# Patient Record
Sex: Female | Born: 1950 | Race: Black or African American | Hispanic: No | Marital: Married | Smoking: Former smoker
Health system: Southern US, Community
[De-identification: ages and names within clinical notes are randomized; demographics above are authoritative.]

## PROBLEM LIST (undated history)

## (undated) DIAGNOSIS — M329 Systemic lupus erythematosus, unspecified: Secondary | ICD-10-CM

## (undated) DIAGNOSIS — M797 Fibromyalgia: Secondary | ICD-10-CM

## (undated) DIAGNOSIS — M810 Age-related osteoporosis without current pathological fracture: Secondary | ICD-10-CM

## (undated) DIAGNOSIS — R0789 Other chest pain: Secondary | ICD-10-CM

## (undated) DIAGNOSIS — R0602 Shortness of breath: Secondary | ICD-10-CM

## (undated) DIAGNOSIS — M069 Rheumatoid arthritis, unspecified: Secondary | ICD-10-CM

## (undated) HISTORY — DX: Other chest pain: R07.89

## (undated) HISTORY — DX: Rheumatoid arthritis, unspecified: M06.9

## (undated) HISTORY — DX: Shortness of breath: R06.02

## (undated) HISTORY — DX: Systemic lupus erythematosus, unspecified: M32.9

## (undated) HISTORY — DX: Fibromyalgia: M79.7

## (undated) HISTORY — DX: Age-related osteoporosis without current pathological fracture: M81.0

## (undated) HISTORY — PX: BILATERAL CARPAL TUNNEL RELEASE: SHX6508

## (undated) HISTORY — PX: TUBAL LIGATION: SHX77

## (undated) HISTORY — PX: EYE SURGERY: SHX253

## (undated) HISTORY — PX: ABDOMINAL HYSTERECTOMY: SHX81

---

## 2015-05-26 DIAGNOSIS — M797 Fibromyalgia: Secondary | ICD-10-CM | POA: Diagnosis not present

## 2015-05-26 DIAGNOSIS — M810 Age-related osteoporosis without current pathological fracture: Secondary | ICD-10-CM | POA: Diagnosis not present

## 2015-05-26 DIAGNOSIS — M329 Systemic lupus erythematosus, unspecified: Secondary | ICD-10-CM | POA: Diagnosis not present

## 2015-05-26 DIAGNOSIS — M0579 Rheumatoid arthritis with rheumatoid factor of multiple sites without organ or systems involvement: Secondary | ICD-10-CM | POA: Diagnosis not present

## 2015-06-09 DIAGNOSIS — M810 Age-related osteoporosis without current pathological fracture: Secondary | ICD-10-CM | POA: Diagnosis not present

## 2015-06-09 DIAGNOSIS — M85841 Other specified disorders of bone density and structure, right hand: Secondary | ICD-10-CM | POA: Diagnosis not present

## 2015-06-09 DIAGNOSIS — M85842 Other specified disorders of bone density and structure, left hand: Secondary | ICD-10-CM | POA: Diagnosis not present

## 2015-06-09 DIAGNOSIS — M329 Systemic lupus erythematosus, unspecified: Secondary | ICD-10-CM | POA: Diagnosis not present

## 2015-06-09 DIAGNOSIS — M180 Bilateral primary osteoarthritis of first carpometacarpal joints: Secondary | ICD-10-CM | POA: Diagnosis not present

## 2015-06-09 DIAGNOSIS — M797 Fibromyalgia: Secondary | ICD-10-CM | POA: Diagnosis not present

## 2015-06-09 DIAGNOSIS — M0579 Rheumatoid arthritis with rheumatoid factor of multiple sites without organ or systems involvement: Secondary | ICD-10-CM | POA: Diagnosis not present

## 2015-06-09 DIAGNOSIS — Z1231 Encounter for screening mammogram for malignant neoplasm of breast: Secondary | ICD-10-CM | POA: Diagnosis not present

## 2016-03-31 ENCOUNTER — Encounter: Payer: Self-pay | Admitting: Endocrinology

## 2016-03-31 ENCOUNTER — Ambulatory Visit (INDEPENDENT_AMBULATORY_CARE_PROVIDER_SITE_OTHER): Payer: Medicare Other | Admitting: Endocrinology

## 2016-03-31 VITALS — BP 126/88 | HR 80 | Wt 206.0 lb

## 2016-03-31 DIAGNOSIS — N951 Menopausal and female climacteric states: Secondary | ICD-10-CM

## 2016-03-31 DIAGNOSIS — M0579 Rheumatoid arthritis with rheumatoid factor of multiple sites without organ or systems involvement: Secondary | ICD-10-CM

## 2016-03-31 DIAGNOSIS — R946 Abnormal results of thyroid function studies: Secondary | ICD-10-CM | POA: Diagnosis not present

## 2016-03-31 DIAGNOSIS — R635 Abnormal weight gain: Secondary | ICD-10-CM

## 2016-03-31 DIAGNOSIS — R5383 Other fatigue: Secondary | ICD-10-CM | POA: Diagnosis not present

## 2016-03-31 DIAGNOSIS — R7989 Other specified abnormal findings of blood chemistry: Secondary | ICD-10-CM

## 2016-03-31 DIAGNOSIS — M069 Rheumatoid arthritis, unspecified: Secondary | ICD-10-CM | POA: Insufficient documentation

## 2016-03-31 LAB — T3, FREE: T3 FREE: 2.9 pg/mL (ref 2.3–4.2)

## 2016-03-31 LAB — T4, FREE: FREE T4: 0.66 ng/dL (ref 0.60–1.60)

## 2016-03-31 LAB — TSH: TSH: 0.4 u[IU]/mL (ref 0.35–4.50)

## 2016-03-31 LAB — FOLLICLE STIMULATING HORMONE: FSH: 99.6 m[IU]/mL

## 2016-03-31 NOTE — Progress Notes (Signed)
Patient ID: Kristina Shannon, female   DOB: 1951/04/09, 65 y.o.   MRN: [id number]            Referring physician: [place]  Chief complaint: Weight gain.  History of Present Illness:  She had called to her physician for evaluation of various symptoms including progressive weight gain She thinks she has gained about 20 pounds over the last year. She also has been having more fatigue. For this reason her thyroid levels were checked and she was seen to have a TSH level of 0.39 but normal free thyroxine index She is referred for further evaluation Has not had a history of goiter in the past   Past Medical History:  Diagnosis Date  . Fibromyalgia   . Lupus   . Osteoporosis   . RA (rheumatoid arthritis) (HCC)     Past Surgical History:  Procedure Laterality Date  . ABDOMINAL HYSTERECTOMY    . EYE SURGERY Bilateral     Family History  Problem Relation Age of Onset  . Congenital heart disease Mother   . Stroke Mother   . Blindness Mother   . Leukemia Father   . Acute lymphoblastic leukemia Father   . Heart disease Father   . Lung cancer Brother   . Throat cancer Brother     Social History:  reports that she quit smoking about a year ago. She has never used smokeless tobacco. Her alcohol and drug histories are not on file.  Allergies: No Known Allergies    Medication List       Accurate as of 03/31/16 11:39 AM. Always use your most recent med list.          acetaminophen 325 MG tablet Commonly known as:  TYLENOL Take 650 mg by mouth every 6 (six) hours as needed.   folic acid 1 MG tablet Commonly known as:  FOLVITE Take 1 mg by mouth daily.   Methotrexate (PF) 25 MG/0.4ML Soaj Inject 25 mg into the skin once a week.   ORENCIA CLICKJECT 125 MG/ML Soaj Generic drug:  Abatacept Inject 125 mg into the skin once a week.   predniSONE 5 MG tablet Commonly known as:  DELTASONE Take 5 mg by mouth daily with breakfast.       LABS:  No visits with results within  1 Week(s) from this visit.  Latest known visit with results is:  No results found for any previous visit.       Review of Systems  Constitutional: Positive for weight gain.  Respiratory: Positive for shortness of breath.   Cardiovascular: Positive for chest pain.  Gastrointestinal: Negative for constipation and diarrhea.  Endocrine: Negative for cold intolerance and decreased concentration.  Musculoskeletal: Positive for muscle aches and muscle cramps.  Skin: Positive for rash.       Recently No rash, has had lupus before  Psychiatric/Behavioral: Positive for insomnia.       > 1 year     PHYSICAL EXAM:  BP 126/88   Pulse 80   Wt 206 lb (93.4 kg)   GENERAL: She has mild generalized obesity There is no supraclavicular fat pads or buffalo hump  No pallor, clubbing, lymphadenopathy  She has significant lower leg edema which is more on the left.  There edema spitting on the shin areas but not laterally and appears to be more for spongy feel.  Has some swelling of the left foot  Skin:  no rash or pigmentation.  Mild thinning of the skin on forearms present  EYES:  Externally normal.  Fundii:  normal discs and vessels.  ENT: Oral mucosa and tongue normal.  THYROID:  Not palpable.  HEART:  Normal  S1 and S2; no murmur or click.  CHEST:  Normal shape.  Lungs: Vescicular breath sounds heard equally.  No crepitations/ wheeze.  ABDOMEN:  No distention.  Liver and spleen not palpable.  No other mass or tenderness.  NEUROLOGICAL: .Reflexes are bilaterally difficult to elicit at ankles, normal at biceps with some slowing of the relaxation.  JOINTS:  She has swelling and some deformity of the left finger joints.   ASSESSMENT:    WEIGHT gain: This is likely to be a combination of stopping smoking a year ago, use of prednisone especially early part of this year, limited physical activity and possible depression  Low normal TSH level: This is not in the hyperthyroid range and not  associated with any symptoms or goiter.  Free thyroxine index was normal and most likely this is of no clinical significance  Leg edema: May be related to venous insufficiency or possibly lymphedema, to discuss with PCP   PLAN:    Recheck thyroid panel including free T3 today.  Discussed that if her thyroid levels including free T4 and free T3 are normal low further action needed.  If free T4 is low may indicate secondary hypothyroidism  Check if assessed to confirm that her pituitary function is normal  Further management to be done if labs are normal otherwise she can follow-up with PCP  She may benefit from nutrition consultation and water exercises   Consultation note sent to the referring physician  [facility] 03/31/2016, 11:39 AM

## 2016-03-31 NOTE — Progress Notes (Signed)
Please let patient know that the thyroid level is low normal secondary to borderline pituitary function but her hormone levels indicated normal pituitary gland health. Would like to recheck her thyroid levels in 3 months to see if this is changed, no need for medication at this time

## 2016-07-07 ENCOUNTER — Other Ambulatory Visit: Payer: Self-pay | Admitting: Family Medicine

## 2016-07-07 ENCOUNTER — Encounter: Payer: Self-pay | Admitting: Family Medicine

## 2016-07-07 ENCOUNTER — Ambulatory Visit (INDEPENDENT_AMBULATORY_CARE_PROVIDER_SITE_OTHER): Payer: Medicare Other | Admitting: Family Medicine

## 2016-07-07 VITALS — BP 99/72 | HR 85 | Ht 63.0 in | Wt 197.6 lb

## 2016-07-07 DIAGNOSIS — R350 Frequency of micturition: Secondary | ICD-10-CM

## 2016-07-07 DIAGNOSIS — Z01419 Encounter for gynecological examination (general) (routine) without abnormal findings: Secondary | ICD-10-CM

## 2016-07-07 DIAGNOSIS — K5901 Slow transit constipation: Secondary | ICD-10-CM

## 2016-07-07 DIAGNOSIS — Z1231 Encounter for screening mammogram for malignant neoplasm of breast: Secondary | ICD-10-CM

## 2016-07-07 DIAGNOSIS — M329 Systemic lupus erythematosus, unspecified: Secondary | ICD-10-CM | POA: Insufficient documentation

## 2016-07-07 DIAGNOSIS — IMO0002 Reserved for concepts with insufficient information to code with codable children: Secondary | ICD-10-CM | POA: Insufficient documentation

## 2016-07-07 DIAGNOSIS — M3219 Other organ or system involvement in systemic lupus erythematosus: Secondary | ICD-10-CM

## 2016-07-07 DIAGNOSIS — Z0000 Encounter for general adult medical examination without abnormal findings: Secondary | ICD-10-CM

## 2016-07-07 DIAGNOSIS — M810 Age-related osteoporosis without current pathological fracture: Secondary | ICD-10-CM

## 2016-07-07 DIAGNOSIS — M797 Fibromyalgia: Secondary | ICD-10-CM

## 2016-07-07 DIAGNOSIS — Z01411 Encounter for gynecological examination (general) (routine) with abnormal findings: Secondary | ICD-10-CM

## 2016-07-07 LAB — POCT URINALYSIS DIP (DEVICE)
BILIRUBIN URINE: NEGATIVE
Glucose, UA: NEGATIVE mg/dL
Hgb urine dipstick: NEGATIVE
KETONES UR: NEGATIVE mg/dL
Leukocytes, UA: NEGATIVE
NITRITE: NEGATIVE
PH: 5.5 (ref 5.0–8.0)
PROTEIN: NEGATIVE mg/dL
Specific Gravity, Urine: 1.01 (ref 1.005–1.030)
Urobilinogen, UA: 0.2 mg/dL (ref 0.0–1.0)

## 2016-07-07 MED ORDER — DOCUSATE SODIUM 100 MG PO CAPS: 100.0000 mg | ORAL_CAPSULE | Freq: Two times a day (BID) | ORAL | 2 refills | Status: DC | PRN

## 2016-07-07 NOTE — Progress Notes (Signed)
Subjective:     Patient ID: Kristina Shannon, female   DOB: 01-26-1951, 66 y.o.   MRN: [id number]  HPI Kristina Shannon is a 66 yo woman presenting to establish care and for screening mammogram referral (last mammogram normal 2 years ago).  She notes she has a family history of breast cancer in her daughter at age 81.  She denies breast pain or masses, but states that she does not perform self-breast exams.  She also complains of frequent urination for the past year.  She urinates once an hour and reports damp underwear throughout the day.  She endorses polydipsia and tingling in her extremities, but she associates the latter with her fibromyalgia.  She notes she has a history of frequent UTI.  She denies fever, dysuria, hematuria, vaginal bleeding, vaginal discharge, or vaginal irritation.  She also requests a medication to help with her chronic constipation.  She is not having difficulty with BM at this time, but has been hospitalized due to severe constipation in the past and would like a medication to use as needed for this issue.  Review of Systems as above     Objective:   Physical Exam Gen: comfortable, in NAD Neck: non-tender with no masses Cv: RRR, no m/r/g Pulm: clear to auscultation bilaterally, no increased work of breathing, no crackles of wheezes Abd: normoactive bowel sounds, non-tender, no masses or hernias, visible lower vertical scar from previous total hysterectomy. Breast: normal appearing, no masses or lesions, no LAD    Assessment:     Kristina Shannon is a 66 yo woman G6 para 3 aborta 3 presenting for establishment of care, screening mammogram and evaluation of polyuria and constipation.  No concern for UTI at this time given UA negative for leuk and nitrites.    Plan:     -Screening mammogram referral. -Prescribed colace for constipation. -Breast exam performed -UA checked

## 2016-07-07 NOTE — Progress Notes (Signed)
Subjective:     Kristina Shannon is a 66 y.o. female and is here for a comprehensive physical exam. The patient reports problems - urinary frequency x 6 months. Constipation since stopping Orencia.  Social History   Social History  . Marital status: Married    Spouse name: N/A  . Number of children: N/A  . Years of education: N/A   Occupational History  . Not on file.   Social History Main Topics  . Smoking status: Former Smoker    Quit date: 04/01/2015  . Smokeless tobacco: Never Used  . Alcohol use Not on file  . Drug use: Unknown  . Sexual activity: Not on file   Other Topics Concern  . Not on file   Social History Narrative  . No narrative on file   Health Maintenance  Topic Date Due  . Hepatitis C Screening  02-24-1951  . TETANUS/TDAP  06/20/1967  . MAMMOGRAM  06/19/1998  . COLONOSCOPY  06/19/1998  . ZOSTAVAX  06/19/2008  . DEXA SCAN  06/19/2013  . PNA vac Low Risk Adult (1 of 2 - PCV13) 06/19/2013  . INFLUENZA VACCINE  12/22/2015    The following portions of the patient's history were reviewed and updated as appropriate: allergies, current medications, past family history, past medical history, past social history, past surgical history and problem list.  Review of Systems Pertinent items noted in HPI and remainder of comprehensive ROS otherwise negative.   Objective:    BP 99/72   Pulse 85   Ht 5\' 3"  (1.6 m)   Wt 197 lb 9.6 oz (89.6 kg)   BMI 35.00 kg/m  General appearance: alert, cooperative, appears stated age and moderately obese Head: Normocephalic, without obvious abnormality, atraumatic Neck: no adenopathy, supple, symmetrical, trachea midline and thyroid not enlarged, symmetric, no tenderness/mass/nodules Lungs: clear to auscultation bilaterally Breasts: normal appearance, no masses or tenderness Heart: regular rate and rhythm, S1, S2 normal, no murmur, click, rub or gallop Abdomen: soft, non-tender; bowel sounds normal; no masses,  no  organomegaly Extremities: edema 3+ Pulses: 2+ and symmetric Skin: Skin color, texture, turgor normal. No rashes or lesions Lymph nodes: Cervical, supraclavicular, and axillary nodes normal. Neurologic: Grossly normal  Urinalysis    Component Value Date/Time   LABSPEC 1.010 07/07/2016 1024   PHURINE 5.5 07/07/2016 1024   GLUCOSEU NEGATIVE 07/07/2016 1024   HGBUR NEGATIVE 07/07/2016 1024   BILIRUBINUR NEGATIVE 07/07/2016 1024   KETONESUR NEGATIVE 07/07/2016 1024   PROTEINUR NEGATIVE 07/07/2016 1024   UROBILINOGEN 0.2 07/07/2016 1024   NITRITE NEGATIVE 07/07/2016 1024   LEUKOCYTESUR NEGATIVE 07/07/2016 1024      Assessment:   GYN female exam.      Plan:   Problem List Items Addressed This Visit      Unprioritized   Lupus   Fibromyalgia   Osteoporosis    Other Visit Diagnoses    Encounter for gynecological examination without abnormal finding    -  Primary   Slow transit constipation       states since stopping Orencia--trial of Colace   Relevant Medications   docusate sodium (COLACE) 100 MG capsule   Encounter for gynecological examination with abnormal finding       Urinary frequency       Relevant Orders   POCT urinalysis dip (device) (Completed)        See After Visit Summary for Counseling Recommendations

## 2016-07-07 NOTE — Patient Instructions (Signed)
Preventive Care 65 Years and Older, Female Preventive care refers to lifestyle choices and visits with your health care provider that can promote health and wellness. What does preventive care include?  A yearly physical exam. This is also called an annual well check.  Dental exams once or twice a year.  Routine eye exams. Ask your health care provider how often you should have your eyes checked.  Personal lifestyle choices, including:  Daily care of your teeth and gums.  Regular physical activity.  Eating a healthy diet.  Avoiding tobacco and drug use.  Limiting alcohol use.  Practicing safe sex.  Taking low-dose aspirin every day.  Taking vitamin and mineral supplements as recommended by your health care provider. What happens during an annual well check? The services and screenings done by your health care provider during your annual well check will depend on your age, overall health, lifestyle risk factors, and family history of disease. Counseling  Your health care provider may ask you questions about your:  Alcohol use.  Tobacco use.  Drug use.  Emotional well-being.  Home and relationship well-being.  Sexual activity.  Eating habits.  History of falls.  Memory and ability to understand (cognition).  Work and work environment.  Reproductive health. Screening  You may have the following tests or measurements:  Height, weight, and BMI.  Blood pressure.  Lipid and cholesterol levels. These may be checked every 5 years, or more frequently if you are over 50 years old.  Skin check.  Lung cancer screening. You may have this screening every year starting at age 55 if you have a 30-pack-year history of smoking and currently smoke or have quit within the past 15 years.  Fecal occult blood test (FOBT) of the stool. You may have this test every year starting at age 50.  Flexible sigmoidoscopy or colonoscopy. You may have a sigmoidoscopy every 5 years or  a colonoscopy every 10 years starting at age 50.  Hepatitis C blood test.  Hepatitis B blood test.  Sexually transmitted disease (STD) testing.  Diabetes screening. This is done by checking your blood sugar (glucose) after you have not eaten for a while (fasting). You may have this done every 1-3 years.  Bone density scan. This is done to screen for osteoporosis. You may have this done starting at age 65.  Mammogram. This may be done every 1-2 years. Talk to your health care provider about how often you should have regular mammograms. Talk with your health care provider about your test results, treatment options, and if necessary, the need for more tests. Vaccines  Your health care provider may recommend certain vaccines, such as:  Influenza vaccine. This is recommended every year.  Tetanus, diphtheria, and acellular pertussis (Tdap, Td) vaccine. You may need a Td booster every 10 years.  Varicella vaccine. You may need this if you have not been vaccinated.  Zoster vaccine. You may need this after age 60.  Measles, mumps, and rubella (MMR) vaccine. You may need at least one dose of MMR if you were born in 1957 or later. You may also need a second dose.  Pneumococcal 13-valent conjugate (PCV13) vaccine. One dose is recommended after age 65.  Pneumococcal polysaccharide (PPSV23) vaccine. One dose is recommended after age 65.  Meningococcal vaccine. You may need this if you have certain conditions.  Hepatitis A vaccine. You may need this if you have certain conditions or if you travel or work in places where you may be exposed to   hepatitis A.  Hepatitis B vaccine. You may need this if you have certain conditions or if you travel or work in places where you may be exposed to hepatitis B.  Haemophilus influenzae type b (Hib) vaccine. You may need this if you have certain conditions. Talk to your health care provider about which screenings and vaccines you need and how often you need  them. This information is not intended to replace advice given to you by your health care provider. Make sure you discuss any questions you have with your health care provider. Document Released: 06/05/2015 Document Revised: 01/27/2016 Document Reviewed: 03/10/2015 Elsevier Interactive Patient Education  2017 [employer]vier Inc.  

## 2016-07-21 ENCOUNTER — Ambulatory Visit: Payer: Medicare Other

## 2016-08-05 ENCOUNTER — Ambulatory Visit
Admission: RE | Admit: 2016-08-05 | Discharge: 2016-08-05 | Disposition: A | Discharge status: Home / Self Care | Payer: Medicare Other | Source: Ambulatory Visit | Attending: Family Medicine | Admitting: Family Medicine

## 2016-08-05 DIAGNOSIS — Z1231 Encounter for screening mammogram for malignant neoplasm of breast: Secondary | ICD-10-CM

## 2016-09-05 ENCOUNTER — Telehealth: Payer: Self-pay | Admitting: Cardiovascular Disease

## 2016-09-05 NOTE — Telephone Encounter (Signed)
Received records from [facility] for appointment on 09/08/16 with Dr [place].  Records put with Dr [place]'s schedule for 09/08/16. lp °

## 2016-09-08 ENCOUNTER — Ambulatory Visit (INDEPENDENT_AMBULATORY_CARE_PROVIDER_SITE_OTHER): Payer: Medicare Other | Admitting: Cardiovascular Disease

## 2016-09-08 ENCOUNTER — Encounter: Payer: Self-pay | Admitting: Cardiovascular Disease

## 2016-09-08 VITALS — BP 100/76 | HR 66 | Ht 63.0 in | Wt 200.0 lb

## 2016-09-08 DIAGNOSIS — R079 Chest pain, unspecified: Secondary | ICD-10-CM | POA: Diagnosis not present

## 2016-09-08 DIAGNOSIS — R0602 Shortness of breath: Secondary | ICD-10-CM | POA: Diagnosis not present

## 2016-09-08 DIAGNOSIS — R0789 Other chest pain: Secondary | ICD-10-CM | POA: Diagnosis not present

## 2016-09-08 DIAGNOSIS — R0609 Other forms of dyspnea: Secondary | ICD-10-CM

## 2016-09-08 HISTORY — DX: Shortness of breath: R06.02

## 2016-09-08 HISTORY — DX: Other chest pain: R07.89

## 2016-09-08 NOTE — Patient Instructions (Addendum)
Medication Instructions:  Your physician recommends that you continue on your current medications as directed. Please refer to the Current Medication list given to you today.  Labwork: none  Testing/Procedures: Your physician has requested that you have an echocardiogram. Echocardiography is a painless test that uses sound waves to create images of your heart. It provides your doctor with information about the size and shape of your heart and how well your heart's chambers and valves are working. This procedure takes approximately one hour. There are no restrictions for this procedure. [facility] AT [address]  Your physician has requested that you have en exercise stress myoview. For further information please visit [email or web address] Please follow instruction sheet, as given. 2 DAY   Follow-Up: Your physician recommends that you schedule a follow-up appointment in: 1 MONTH OV  If you need a refill on your cardiac medications before your next appointment, please call your pharmacy.

## 2016-09-08 NOTE — Progress Notes (Signed)
Cardiology Office Note   Date:  09/08/2016   ID:  Kristina Shannon, DOB 03-Jul-1950, MRN [id number]  PCP:  Irving Copas, MD  Cardiologist:   Chilton Si, MD   Chief Complaint  Patient presents with  . New Patient (Initial Visit)  . Palpitations      History of Present Illness: Kristina Shannon is a 66 y.o. female with Rheumatoid arthritis, SLE and osteoporosis, who presents for an evaluation of chest pressure.  Kristina Shannon saw Dr. Henrine Screws on 08/31/16 and reported palpitations and chest pressure.  BMP was unremarkable.  She was referred to cardiology for further evaluation.   Kristina Shannon feels like there is a brick on her chest when she walks.  She exercises daily by walking.  She feels OK walking down hills but feels like she can't catch her breath when walking up hills.  She also reports chest pain that feels like someone is stabbing her.  This gets better with rest but also sometimes occurs at rest.  The episodes last for approximately 10 minutes and are not associated with nausea or diaphoresis.  These symptoms have been ongoing for 3-4 years but getting worse lately.   Kristina Shannon reports lower extremity edema but denies orthopnea or PND.  She moved to [place] from [place] one year ago.  She quit smoking 3 months ago after smoking for 8 years.  She smoked less than 1 pack per week  Past Medical History:  Diagnosis Date  . Atypical chest pain 09/08/2016  . Fibromyalgia   . Lupus   . Osteoporosis   . RA (rheumatoid arthritis) (HCC)   . Shortness of breath 09/08/2016    Past Surgical History:  Procedure Laterality Date  . ABDOMINAL HYSTERECTOMY    . BILATERAL CARPAL TUNNEL RELEASE    . EYE SURGERY Bilateral   . TUBAL LIGATION       Current Outpatient Prescriptions  Medication Sig Dispense Refill  . Abatacept (ORENCIA CLICKJECT) 125 MG/ML SOAJ Inject 125 mg into the skin once a week.    Marland Kitchen acetaminophen (TYLENOL) 325 MG tablet Take 650 mg by mouth every 6  (six) hours as needed.    . docusate sodium (COLACE) 100 MG capsule Take 1 capsule (100 mg total) by mouth 2 (two) times daily as needed. 30 capsule 2  . folic acid (FOLVITE) 1 MG tablet Take 1 mg by mouth daily.    . Methotrexate, PF, 25 MG/0.4ML SOAJ Inject 25 mg into the skin once a week.    . predniSONE (DELTASONE) 5 MG tablet Take 5 mg by mouth daily with breakfast.     No current facility-administered medications for this visit.     Allergies:   Patient has no known allergies.    Social History:  The patient  reports that she quit smoking about 17 months ago. She has never used smokeless tobacco.   Family History:  The patient's family history includes Acute lymphoblastic leukemia in her father; Blindness in her mother; Breast cancer (age of onset: 56) in her daughter; COPD in her mother; Congenital heart disease in her mother; Heart attack in her brother and father; Heart disease in her father; Heart failure in her mother; Leukemia in her father; Lung cancer in her brother; Multiple sclerosis in her brother; Stroke in her mother; Thyroid disease in her brother and other.    ROS:  Please see the history of present illness.   Otherwise, review of systems are positive for low back pain,  skin peeling off.   All other systems are reviewed and negative.    PHYSICAL EXAM: VS:  BP 100/76   Pulse 66   Ht 5\' 3"  (1.6 m)   Wt 90.7 kg (200 lb)   BMI 35.43 kg/m  , BMI Body mass index is 35.43 kg/m. GENERAL:  Well appearing HEENT:  Pupils equal round and reactive, fundi not visualized, oral mucosa unremarkable NECK:  No jugular venous distention, waveform within normal limits, carotid upstroke brisk and symmetric, no bruits, no thyromegaly LYMPHATICS:  No cervical adenopathy LUNGS:  Clear to auscultation bilaterally HEART:  RRR.  PMI not displaced or sustained,S1 and S2 within normal limits, no S3, no S4, no clicks, no rubs, no murmurs ABD:  Flat, positive bowel sounds normal in frequency  in pitch, no bruits, no rebound, no guarding, no midline pulsatile mass, no hepatomegaly, no splenomegaly EXT:  2 plus pulses throughout, no edema, no cyanosis no clubbing SKIN:  No rashes no nodules NEURO:  Cranial nerves II through XII grossly intact, motor grossly intact throughout PSYCH:  Cognitively intact, oriented to person place and time    EKG:  EKG is ordered today. The ekg ordered today demonstrates sinus rhythm. Rate 66 bpm. Low voltage limb leads and precordial leads.   Recent Labs: 03/31/2016: TSH 0.40  08/30/16: Sodium 139, potassium 4.1, BUN 15, creatinine 0.78 AST 14, ALT 13  Lipid Panel No results found for: CHOL, TRIG, HDL, CHOLHDL, VLDL, LDLCALC, LDLDIRECT    Wt Readings from Last 3 Encounters:  09/08/16 90.7 kg (200 lb)  07/07/16 89.6 kg (197 lb 9.6 oz)  03/31/16 93.4 kg (206 lb)      ASSESSMENT AND PLAN:  # Atypical chest pain: # Shortness of breath:  Kristina Shannon symptoms are atypical but she does have exertional symptoms.  We will get an exercise Myoview and an echo to assess.  There is no evidence of heart failure on exam.  # CV Disease Prevention: Will get a copy of her lipids from Dr. Abigail Miyamoto.   Current medicines are reviewed at length with the patient today.  The patient does not have concerns regarding medicines.  The following changes have been made:  no change  Labs/ tests ordered today include:   Orders Placed This Encounter  Procedures  . Myocardial Perfusion Imaging  . EKG 12-Lead  . ECHOCARDIOGRAM COMPLETE     Disposition:   FU with Kristina Goodrich C. Duke Salvia, MD, [facility] in 1 month.   This note was written with the assistance of speech recognition software.  Please excuse any transcriptional errors.  Signed, Shree Espey C. Duke Salvia, MD, [facility]  09/08/2016 11:57 PM    [facility]

## 2016-09-20 ENCOUNTER — Telehealth (HOSPITAL_COMMUNITY): Payer: Self-pay

## 2016-09-20 ENCOUNTER — Ambulatory Visit (HOSPITAL_COMMUNITY): Payer: Medicare Other

## 2016-09-20 NOTE — Telephone Encounter (Signed)
Encounter complete. 

## 2016-09-21 ENCOUNTER — Inpatient Hospital Stay (HOSPITAL_COMMUNITY): Admission: RE | Admit: 2016-09-21 | Payer: Medicare Other | Source: Ambulatory Visit

## 2016-09-22 ENCOUNTER — Ambulatory Visit (HOSPITAL_COMMUNITY)
Admission: RE | Admit: 2016-09-22 | Discharge: 2016-09-22 | Disposition: A | Discharge status: Home / Self Care | Payer: Medicare Other | Source: Ambulatory Visit | Attending: Cardiology | Admitting: Cardiology

## 2016-09-22 DIAGNOSIS — R079 Chest pain, unspecified: Secondary | ICD-10-CM | POA: Diagnosis not present

## 2016-09-22 DIAGNOSIS — R0602 Shortness of breath: Secondary | ICD-10-CM | POA: Diagnosis not present

## 2016-09-22 MED ORDER — TECHNETIUM TC 99M TETROFOSMIN IV KIT
30.9000 | PACK | Freq: Once | INTRAVENOUS | Status: AC | PRN
  Administered 2016-09-22: 30.9 via INTRAVENOUS
  Filled 2016-09-22: qty 31

## 2016-09-23 ENCOUNTER — Ambulatory Visit (HOSPITAL_COMMUNITY)
Admission: RE | Admit: 2016-09-23 | Discharge: 2016-09-23 | Disposition: A | Discharge status: Home / Self Care | Payer: Medicare Other | Source: Ambulatory Visit | Attending: Cardiovascular Disease | Admitting: Cardiovascular Disease

## 2016-09-23 LAB — MYOCARDIAL PERFUSION IMAGING
CHL CUP MPHR: 152 {beats}/min
CHL CUP NUCLEAR SRS: 1
CHL CUP RESTING HR STRESS: 67 {beats}/min
Estimated workload: 7 METS
Exercise duration (min): 5 min
Exercise duration (sec): 41 s
LV dias vol: 70 mL (ref 46–106)
LV sys vol: 24 mL
Peak HR: 144 {beats}/min
Percent HR: 94 %
RPE: 18
SDS: 2
SSS: 3
TID: 0.88

## 2016-09-23 MED ORDER — TECHNETIUM TC 99M TETROFOSMIN IV KIT
30.8000 | PACK | Freq: Once | INTRAVENOUS | Status: AC | PRN
  Administered 2016-09-23: 30.8 via INTRAVENOUS

## 2016-09-26 ENCOUNTER — Other Ambulatory Visit: Payer: Self-pay

## 2016-09-26 ENCOUNTER — Ambulatory Visit (HOSPITAL_COMMUNITY): Payer: Medicare Other | Attending: Cardiology

## 2016-09-26 ENCOUNTER — Telehealth: Payer: Self-pay | Admitting: Cardiovascular Disease

## 2016-09-26 DIAGNOSIS — R0602 Shortness of breath: Secondary | ICD-10-CM | POA: Diagnosis not present

## 2016-09-26 DIAGNOSIS — I340 Nonrheumatic mitral (valve) insufficiency: Secondary | ICD-10-CM | POA: Insufficient documentation

## 2016-09-26 DIAGNOSIS — R079 Chest pain, unspecified: Secondary | ICD-10-CM | POA: Insufficient documentation

## 2016-09-26 NOTE — Telephone Encounter (Signed)
Patient states she was notified that her stress test was OK She wanted to know what her echo will show -- explained this to patient - valves, chamber sizes, systolic/diastolic function, thickness of heart muscle etc She states she had chest pain after her stress test -- explained that chest discomfort can be neuropathic, MSK in nature, could be d/t breathing/lung problems  Advised that she should likely have results by tomorrow or Wednesday   Routed to [place] as FYI

## 2016-09-26 NOTE — Telephone Encounter (Signed)
New message     Patient has couples of questions for the nurse. Asking for a call back.

## 2016-10-04 ENCOUNTER — Telehealth: Payer: Self-pay | Admitting: Cardiovascular Disease

## 2016-10-04 NOTE — Telephone Encounter (Signed)
Patient calling and would like results from echo. Thanks.

## 2016-10-04 NOTE — Telephone Encounter (Signed)
Returned the call to the patient to inform her that her provider has not read the results yet and the nurse would call her back when they had been read. She verbalized her understanding.

## 2016-10-13 ENCOUNTER — Ambulatory Visit: Payer: Medicare Other | Admitting: Cardiovascular Disease

## 2016-10-21 ENCOUNTER — Ambulatory Visit (INDEPENDENT_AMBULATORY_CARE_PROVIDER_SITE_OTHER): Payer: Medicare Other | Admitting: Cardiovascular Disease

## 2016-10-21 ENCOUNTER — Encounter: Payer: Self-pay | Admitting: Cardiovascular Disease

## 2016-10-21 VITALS — BP 100/70 | HR 86 | Ht 63.0 in | Wt 199.0 lb

## 2016-10-21 DIAGNOSIS — R0602 Shortness of breath: Secondary | ICD-10-CM

## 2016-10-21 DIAGNOSIS — G4730 Sleep apnea, unspecified: Secondary | ICD-10-CM | POA: Diagnosis not present

## 2016-10-21 DIAGNOSIS — R0789 Other chest pain: Secondary | ICD-10-CM

## 2016-10-21 NOTE — Progress Notes (Signed)
Cardiology Office Note   Date:  10/21/2016   ID:  Kristina Shannon, DOB 1951-03-01, MRN [id number]  PCP:  Kristina Screws, MD  Cardiologist:   Kristina Si, MD   Chief Complaint  Patient presents with  . Follow-up      History of Present Illness: Kristina Shannon is a 66 y.o. female with Rheumatoid arthritis, OSA, SLE and osteoporosis, who presents for an evaluation of chest pressure.  Kristina Shannon saw Dr. Henrine Shannon on 08/31/16 and reported palpitations and chest pressure.  BMP was unremarkable.  She was referred to cardiology for further evaluation.  She was referred for an exercise Myoview 09/2016 that revealed LVEF 66% and was negative for ischemia.  She had an echo with LVEF 55-50% and grade 1 diastolic dysfunction and was otherwise unremarkable.   She has been feeling well but continues to have chest discomfort that occurs both with exertion.  It is also hard for her to breathe.  She thinks that it may be related to her weight. She tries to walk regularly and does aerobics in her house.  She is also eating a more plant-based diet.    Kristina Shannon has a history of OSA and previously uses a CPAP but stopped over 4 years ago.  She was unable to tolerate the mask.  She reports that she has been stressed since moving to [place].  She also has a harder time walking here because of the hills.     Past Medical History:  Diagnosis Date  . Atypical chest pain 09/08/2016  . Fibromyalgia   . Lupus   . Osteoporosis   . RA (rheumatoid arthritis) (HCC)   . Shortness of breath 09/08/2016    Past Surgical History:  Procedure Laterality Date  . ABDOMINAL HYSTERECTOMY    . BILATERAL CARPAL TUNNEL RELEASE    . EYE SURGERY Bilateral   . TUBAL LIGATION       Current Outpatient Prescriptions  Medication Sig Dispense Refill  . Abatacept (ORENCIA CLICKJECT) 125 MG/ML SOAJ Inject 125 mg into the skin once a week.    Marland Kitchen acetaminophen (TYLENOL) 325 MG tablet Take 650 mg by mouth every 6 (six) hours  as needed.    . docusate sodium (COLACE) 100 MG capsule Take 1 capsule (100 mg total) by mouth 2 (two) times daily as needed. 30 capsule 2  . folic acid (FOLVITE) 1 MG tablet Take 1 mg by mouth daily.    . Methotrexate, PF, 25 MG/0.4ML SOAJ Inject 25 mg into the skin once a week.    . predniSONE (DELTASONE) 5 MG tablet Take 5 mg by mouth daily with breakfast.     No current facility-administered medications for this visit.     Allergies:   Patient has no known allergies.    Social History:  The patient  reports that she quit smoking about 18 months ago. She has never used smokeless tobacco.   Family History:  The patient's family history includes Acute lymphoblastic leukemia in her father; Blindness in her mother; Breast cancer (age of onset: 67) in her daughter; COPD in her mother; Congenital heart disease in her mother; Heart attack in her brother and father; Heart disease in her father; Heart failure in her mother; Leukemia in her father; Lung cancer in her brother; Multiple sclerosis in her brother; Stroke in her mother; Thyroid disease in her brother and other.    ROS:  Please see the history of present illness.   Otherwise, review of systems are  positive for low back pain, skin peeling off.   All other systems are reviewed and negative.    PHYSICAL EXAM: VS:  BP 100/70 (BP Location: Left Arm, Patient Position: Sitting, Cuff Size: Large)   Pulse 86   Ht 5\' 3"  (1.6 m)   Wt 90.3 kg (199 lb)   BMI 35.25 kg/m  , BMI Body mass index is 35.25 kg/m. GENERAL:  Well appearing.  No acute distress. HEENT:  Pupils equal round and reactive, fundi not visualized, oral mucosa unremarkable NECK:  No jugular venous distention, waveform within normal limits, carotid upstroke brisk and symmetric, no bruits LUNGS:  Clear to auscultation bilaterally.  No crackles, wheezes or rhonhi HEART:  RRR.  PMI not displaced or sustained,S1 and S2 within normal limits, no S3, no S4, no clicks, no rubs, no  murmurs ABD:  Flat, positive bowel sounds normal in frequency in pitch, no bruits, no rebound, no guarding, no midline pulsatile mass, no hepatomegaly, no splenomegaly EXT:  2 plus pulses throughout, no edema, no cyanosis no clubbing SKIN:  No rashes no nodules NEURO:  Cranial nerves II through XII grossly intact, motor grossly intact throughout PSYCH:  Cognitively intact, oriented to person place and time   EKG:  EKG is not ordered today. The ekg ordered 09/08/16 demonstrates sinus rhythm. Rate 66 bpm. Low voltage limb leads and precordial leads.  Exercise Myoview 09/23/16: The left ventricular ejection fraction is hyperdynamic (>65%).  Nuclear stress EF: 66%.  Blood pressure demonstrated a normal response to exercise.  No T wave inversion was noted during stress.  There was no ST segment deviation noted during stress.  This is a low risk study.   Echo 09/26/16:  Study Conclusions  - Left ventricle: The cavity size was normal. Wall thickness was   normal. Systolic function was normal. The estimated ejection   fraction was in the range of 55% to 60%. Wall motion was normal;   there were no regional wall motion abnormalities. Doppler   parameters are consistent with abnormal left ventricular   relaxation (grade 1 diastolic dysfunction). - Mitral valve: There was mild regurgitation.  Impressions:  - Normal LV systolic function; mild diastolic dysfunction; mild MR   and TR.   Recent Labs: 03/31/2016: TSH 0.40  08/30/16: Sodium 139, potassium 4.1, BUN 15, creatinine 0.78 AST 14, ALT 13  Lipid Panel No results found for: CHOL, TRIG, HDL, CHOLHDL, VLDL, LDLCALC, LDLDIRECT    Wt Readings from Last 3 Encounters:  10/21/16 90.3 kg (199 lb)  09/22/16 90.7 kg (200 lb)  09/08/16 90.7 kg (200 lb)      ASSESSMENT AND PLAN:  # Atypical chest pain: # Shortness of breath:  Stress test was negative.  She continues to have symptoms.  She has known OSA that is untreated.  We will  refer her for a sleep study.  Consider referral to pulmonary.   Current medicines are reviewed at length with the patient today.  The patient does not have concerns regarding medicines.  The following changes have been made:  no change  Labs/ tests ordered today include:   Orders Placed This Encounter  Procedures  . Split night study     Disposition:   FU with Delphina Schum C. Duke Salvia, MD, [facility] as needed   This note was written with the assistance of speech recognition software.  Please excuse any transcriptional errors.  Signed, Lawan Nanez C. Duke Salvia, MD, [facility]  10/21/2016 2:37 PM    [facility]

## 2016-10-21 NOTE — Patient Instructions (Signed)
Medication Instructions:  Your physician recommends that you continue on your current medications as directed. Please refer to the Current Medication list given to you today.  Labwork: none  Testing/Procedures: Your physician has recommended that you have a sleep study. This test records several body functions during sleep, including: brain activity, eye movement, oxygen and carbon dioxide blood levels, heart rate and rhythm, breathing rate and rhythm, the flow of air through your mouth and nose, snoring, body muscle movements, and chest and belly movement.  Follow-Up: As needed

## 2016-12-22 ENCOUNTER — Encounter (HOSPITAL_BASED_OUTPATIENT_CLINIC_OR_DEPARTMENT_OTHER): Payer: Medicare Other

## 2016-12-28 ENCOUNTER — Telehealth: Payer: Self-pay | Admitting: *Deleted

## 2016-12-28 NOTE — Telephone Encounter (Signed)
Received a voicemail today form Kristina Shannon stating she saw Dr. Shawnie Pons previously and she prescribed colace for constipation. She states this does not work for her - has taken 2 prescriptions of this. Wants to know if she can get a prescription for linzess. Wants a call back.   I called Kristina Shannon and she explains she has a history of constipation and has been taking colace 1to 3 times a day , lots of fluids, and it doesn't work. States she also has been using fleets enema, metamucil, etc as needed.  States another doctor she saw for anxiety once gave her samples of linzess and it worked well for her. She states she does not want to take stool softners. I informed her Dr. Shawnie Pons is not in our office today and I will send her request to her electronically and once we hear back we will call her back. She voices understanding.

## 2017-01-02 NOTE — Telephone Encounter (Signed)
I called Jesika back and informed her she will need to call her pcp or gi doctor. She voices frustration, but voices understanding. Support given.

## 2017-01-31 ENCOUNTER — Other Ambulatory Visit: Payer: Self-pay | Admitting: Family Medicine

## 2017-01-31 DIAGNOSIS — M545 Low back pain: Secondary | ICD-10-CM

## 2017-02-03 ENCOUNTER — Other Ambulatory Visit: Payer: Medicare Other

## 2017-02-12 ENCOUNTER — Ambulatory Visit
Admission: RE | Admit: 2017-02-12 | Discharge: 2017-02-12 | Disposition: A | Discharge status: Home / Self Care | Payer: Medicare Other | Source: Ambulatory Visit | Attending: Family Medicine | Admitting: Family Medicine

## 2017-02-12 DIAGNOSIS — M545 Low back pain: Secondary | ICD-10-CM

## 2017-06-26 ENCOUNTER — Other Ambulatory Visit: Payer: Self-pay | Admitting: Family Medicine

## 2017-06-26 DIAGNOSIS — Z1231 Encounter for screening mammogram for malignant neoplasm of breast: Secondary | ICD-10-CM

## 2017-08-07 ENCOUNTER — Ambulatory Visit: Payer: Medicare Other

## 2017-08-10 ENCOUNTER — Other Ambulatory Visit: Payer: Self-pay | Admitting: Family Medicine

## 2017-08-10 DIAGNOSIS — R1030 Lower abdominal pain, unspecified: Secondary | ICD-10-CM

## 2017-08-11 ENCOUNTER — Ambulatory Visit
Admission: RE | Admit: 2017-08-11 | Discharge: 2017-08-11 | Disposition: A | Discharge status: Home / Self Care | Payer: Medicare Other | Source: Ambulatory Visit | Attending: Family Medicine | Admitting: Family Medicine

## 2017-08-11 DIAGNOSIS — R1030 Lower abdominal pain, unspecified: Secondary | ICD-10-CM

## 2017-08-11 MED ORDER — IOPAMIDOL (ISOVUE-300) INJECTION 61%
125.0000 mL | Freq: Once | INTRAVENOUS | Status: AC | PRN
  Administered 2017-08-11: 125 mL via INTRAVENOUS

## 2017-09-07 ENCOUNTER — Other Ambulatory Visit: Payer: Self-pay

## 2017-09-07 ENCOUNTER — Emergency Department (HOSPITAL_COMMUNITY)
Admission: EM | Admit: 2017-09-07 | Discharge: 2017-09-07 | Disposition: A | Discharge status: Home / Self Care | Payer: Medicare Other | Attending: Emergency Medicine | Admitting: Emergency Medicine

## 2017-09-07 ENCOUNTER — Encounter (HOSPITAL_COMMUNITY): Payer: Self-pay | Admitting: Emergency Medicine

## 2017-09-07 ENCOUNTER — Emergency Department (HOSPITAL_COMMUNITY): Payer: Medicare Other

## 2017-09-07 DIAGNOSIS — Z7982 Long term (current) use of aspirin: Secondary | ICD-10-CM | POA: Insufficient documentation

## 2017-09-07 DIAGNOSIS — Z87891 Personal history of nicotine dependence: Secondary | ICD-10-CM | POA: Diagnosis not present

## 2017-09-07 DIAGNOSIS — R0789 Other chest pain: Secondary | ICD-10-CM

## 2017-09-07 DIAGNOSIS — Z79899 Other long term (current) drug therapy: Secondary | ICD-10-CM | POA: Diagnosis not present

## 2017-09-07 DIAGNOSIS — E876 Hypokalemia: Secondary | ICD-10-CM | POA: Insufficient documentation

## 2017-09-07 DIAGNOSIS — R030 Elevated blood-pressure reading, without diagnosis of hypertension: Secondary | ICD-10-CM | POA: Diagnosis not present

## 2017-09-07 LAB — CBC
HCT: 37.8 % (ref 36.0–46.0)
HEMOGLOBIN: 13.7 g/dL (ref 12.0–15.0)
MCH: 30.5 pg (ref 26.0–34.0)
MCHC: 36.2 g/dL — AB (ref 30.0–36.0)
MCV: 84.2 fL (ref 78.0–100.0)
PLATELETS: 189 10*3/uL (ref 150–400)
RBC: 4.49 MIL/uL (ref 3.87–5.11)
RDW: 13.6 % (ref 11.5–15.5)
WBC: 10.5 10*3/uL (ref 4.0–10.5)

## 2017-09-07 LAB — BASIC METABOLIC PANEL
ANION GAP: 14 (ref 5–15)
BUN: <5 mg/dL — ABNORMAL LOW (ref 6–20)
CALCIUM: 8.8 mg/dL — AB (ref 8.9–10.3)
CO2: 26 mmol/L (ref 22–32)
CREATININE: 0.75 mg/dL (ref 0.44–1.00)
Chloride: 98 mmol/L — ABNORMAL LOW (ref 101–111)
Glucose, Bld: 113 mg/dL — ABNORMAL HIGH (ref 65–99)
Potassium: 2.6 mmol/L — CL (ref 3.5–5.1)
Sodium: 138 mmol/L (ref 135–145)

## 2017-09-07 LAB — I-STAT TROPONIN, ED: TROPONIN I, POC: 0.01 ng/mL (ref 0.00–0.08)

## 2017-09-07 LAB — MAGNESIUM: Magnesium: 2 mg/dL (ref 1.7–2.4)

## 2017-09-07 MED ORDER — POTASSIUM CHLORIDE CRYS ER 20 MEQ PO TBCR: 40.0000 meq | EXTENDED_RELEASE_TABLET | Freq: Once | ORAL | Status: DC

## 2017-09-07 MED ORDER — POTASSIUM CHLORIDE CRYS ER 20 MEQ PO TBCR
60.0000 meq | EXTENDED_RELEASE_TABLET | Freq: Once | ORAL | Status: AC
  Administered 2017-09-07: 60 meq via ORAL
  Filled 2017-09-07: qty 3

## 2017-09-07 NOTE — ED Notes (Signed)
Dr Rennis Chris notified pt not in room unable to collect chem 8

## 2017-09-07 NOTE — ED Provider Notes (Signed)
[facility] EMERGENCY DEPARTMENT Provider Note   CSN: [id number] Arrival date & time: 09/07/17  1508     History   Chief Complaint Chief Complaint  Patient presents with  . Chest Pain    HPI Kristina Shannon is a 67 y.o. female.  Complains of chest pain right-sided anterior rating to the back gradual onset 3 days ago pain is worse with deep inspiration or changing positions improved with remaining still.  She denies any shortness of breath.  She states "cold systems" with cough, nonproductive for the past 3 months.  She denies generalized weakness.  No other associated symptoms treated with Tylenol which she states "makes me sleep" pain is mild at present.  No nausea no sweatiness.  No other associated symptoms  HPI  Past Medical History:  Diagnosis Date  . Atypical chest pain 09/08/2016  . Fibromyalgia   . Lupus (HCC)   . Osteoporosis   . RA (rheumatoid arthritis) (HCC)   . Shortness of breath 09/08/2016    Patient Active Problem List   Diagnosis Date Noted  . Atypical chest pain 09/08/2016  . Shortness of breath 09/08/2016  . Lupus (HCC)   . Fibromyalgia   . Osteoporosis   . RA (rheumatoid arthritis) (HCC)     Past Surgical History:  Procedure Laterality Date  . ABDOMINAL HYSTERECTOMY    . BILATERAL CARPAL TUNNEL RELEASE    . EYE SURGERY Bilateral   . TUBAL LIGATION       OB History    Gravida  6   Para  3   Term      Preterm      AB  3   Living  3     SAB  1   TAB  2   Ectopic      Multiple      Live Births               Home Medications    Prior to Admission medications   Medication Sig Start Date End Date Taking? Authorizing Provider  Abatacept (ORENCIA CLICKJECT) 125 MG/ML SOAJ Inject 125 mg into the skin every Friday.    Yes [provider]  acetaminophen (TYLENOL) 500 MG tablet Take 500-1,000 mg by mouth every 6 (six) hours as needed (for pain).   Yes [provider]  aspirin EC 325 MG tablet  Take 325-650 mg by mouth once as needed (for chest pain).   Yes [provider]  escitalopram (LEXAPRO) 10 MG tablet Take 10 mg by mouth daily.   Yes [provider]  hydrochlorothiazide (HYDRODIURIL) 25 MG tablet Take 25 mg by mouth daily.   Yes [provider]  leflunomide (ARAVA) 20 MG tablet Take 20 mg by mouth daily.   Yes [provider]  linaclotide (LINZESS) 290 MCG CAPS capsule Take 290 mcg by mouth daily as needed (for constipation).   Yes [provider]  pantoprazole (PROTONIX) 40 MG tablet Take 40 mg by mouth daily before breakfast.   Yes [provider]  acetaminophen (TYLENOL) 325 MG tablet Take 650 mg by mouth every 6 (six) hours as needed.    [provider]  docusate sodium (COLACE) 100 MG capsule Take 1 capsule (100 mg total) by mouth 2 (two) times daily as needed. Patient not taking: Reported on 09/07/2017 07/07/16   Reva Bores, MD  folic acid (FOLVITE) 1 MG tablet Take 1 mg by mouth daily.    [provider]  Methotrexate,  PF, 25 MG/0.4ML SOAJ Inject 25 mg into the skin once a week.    [provider]  predniSONE (DELTASONE) 5 MG tablet Take 5 mg by mouth daily with breakfast.    [provider]    Family History Family History  Problem Relation Age of Onset  . Congenital heart disease Mother   . Stroke Mother   . Blindness Mother   . COPD Mother   . Heart failure Mother   . Leukemia Father   . Acute lymphoblastic leukemia Father   . Heart disease Father   . Heart attack Father   . Lung cancer Brother   . Thyroid disease Brother   . Heart attack Brother   . Multiple sclerosis Brother   . Thyroid disease Other   . Breast cancer Daughter 62    Social History Social History   Tobacco Use  . Smoking status: Former Smoker    Last attempt to quit: 04/01/2015    Years since quitting: 2.4  . Smokeless tobacco: Never Used  Substance Use Topics  . Alcohol use: Not on file    . Drug use: Not on file     Allergies   Augmentin [amoxicillin-pot clavulanate]   Review of Systems Review of Systems  Constitutional: Negative.   HENT: Negative.   Respiratory: Positive for cough.   Cardiovascular: Positive for chest pain and leg swelling.       Chronic leg edema for several decades, unchanged  Gastrointestinal: Negative.   Musculoskeletal: Negative.   Skin: Negative.   Neurological: Negative.   Psychiatric/Behavioral: Negative.   All other systems reviewed and are negative.    Physical Exam Updated Vital Signs BP (!) 111/8 (BP Location: Left Arm)   Pulse (!) 102   Temp 98.6 F (37 C) (Oral)   Resp 18   SpO2 100%   Physical Exam  Constitutional: She appears well-developed and well-nourished.  HENT:  Head: Normocephalic and atraumatic.  Eyes: Pupils are equal, round, and reactive to light. Conjunctivae are normal.  Neck: Neck supple. No tracheal deviation present. No thyromegaly present.  Cardiovascular: Normal rate and regular rhythm.  No murmur heard. Pulmonary/Chest: Effort normal and breath sounds normal. She exhibits tenderness.  Right chest tender, reproducing pain exactly  Abdominal: Soft. Bowel sounds are normal. She exhibits no distension. There is no tenderness.  Musculoskeletal: Normal range of motion. She exhibits no edema or tenderness.  Neurological: She is alert. Coordination normal.  Skin: Skin is warm and dry. No rash noted.  Psychiatric: She has a normal mood and affect.  Nursing note and vitals reviewed.    ED Treatments / Results  Labs (all labs ordered are listed, but only abnormal results are displayed) Labs Reviewed  BASIC METABOLIC PANEL - Abnormal; Notable for the following components:      Result Value   Potassium 2.6 (*)    Chloride 98 (*)    Glucose, Bld 113 (*)    BUN <5 (*)    Calcium 8.8 (*)    All other components within normal limits  CBC - Abnormal; Notable for the following components:   MCHC 36.2  (*)    All other components within normal limits  MAGNESIUM  I-STAT TROPONIN, ED    EKG EKG Interpretation  Date/Time:  Thursday September 07 2017 15:16:34 EDT Ventricular Rate:  113 PR Interval:  144 QRS Duration: 70 QT Interval:  370 QTC Calculation: 507 R Axis:   63 Text Interpretation:  Sinus tachycardia Low voltage QRS  Borderline ECG No significant change since last tracing Confirmed by Doug Sou [phone]) on 09/07/2017 6:48:14 PM   Radiology Dg Chest 2 View  Result Date: 09/07/2017 CLINICAL DATA:  Chest pain. EXAM: CHEST - 2 VIEW COMPARISON:  None. FINDINGS: The heart size and mediastinal contours are within normal limits. Both lungs are clear. No pneumothorax or pleural effusion is noted. The visualized skeletal structures are unremarkable. IMPRESSION: No active cardiopulmonary disease. Electronically Signed   By: Lupita Raider, M.D.   On: 09/07/2017 16:41    Procedures Procedures (including critical care time)  Medications Ordered in ED Medications  potassium chloride SA (K-DUR,KLOR-CON) CR tablet 60 mEq (has no administration in time range)  Chest x-ray viewed by me  Initial Impression / Assessment and Plan / ED Course  I have reviewed the triage vital signs and the nursing notes.  Pertinent labs & imaging results that were available during my care of the patient were reviewed by me and considered in my medical decision making (see chart for details).      administered oral potassium here At 8 PM patient was not in the room and had left without notifying staff..  History exam is consistent with chest wall pain your lab work consistent with hypokalemia and hypocalcemia. Final Clinical Impressions(s) / ED Diagnoses  Diagnoses #1 chest wall pain Final diagnoses:  None  #2 hypokalemia #3 hypocalcemia #4 elevated blood pressure  ED Discharge Orders    None       Doug Sou, MD 09/07/17 [phone]

## 2017-09-07 NOTE — ED Notes (Signed)
Pt is going to receive 60 mEq of potassium PO per MD Kohut.

## 2017-09-07 NOTE — ED Notes (Signed)
Went to pt's room to draw I-stat chem 8, pt not there.  Cardiac leads on the floor, gown on the bed, side rails still up.  Consulting civil engineer notified.

## 2017-09-07 NOTE — ED Triage Notes (Signed)
Pt states centralized chest pain radiating into the right breast since Tuesday, the pain is constant. Pt also has cold like symptoms and reports weakness and decreased appetite. Denies fevers. Pt states non productive cough. mucinex not working. Up all night.

## 2017-09-07 NOTE — ED Notes (Signed)
RN informed MD Juleen China about K+ of 2.6.

## 2017-09-26 ENCOUNTER — Other Ambulatory Visit: Payer: Self-pay | Admitting: Family Medicine

## 2017-09-26 DIAGNOSIS — M7989 Other specified soft tissue disorders: Secondary | ICD-10-CM

## 2017-10-02 ENCOUNTER — Ambulatory Visit
Admission: RE | Admit: 2017-10-02 | Discharge: 2017-10-02 | Disposition: A | Discharge status: Home / Self Care | Payer: Medicare Other | Source: Ambulatory Visit | Attending: Family Medicine | Admitting: Family Medicine

## 2017-10-02 DIAGNOSIS — M7989 Other specified soft tissue disorders: Secondary | ICD-10-CM

## 2017-10-08 ENCOUNTER — Emergency Department (HOSPITAL_COMMUNITY)
Admission: EM | Admit: 2017-10-08 | Discharge: 2017-10-08 | Disposition: A | Discharge status: Home / Self Care | Payer: Medicare Other | Attending: Emergency Medicine | Admitting: Emergency Medicine

## 2017-10-08 ENCOUNTER — Encounter (HOSPITAL_COMMUNITY): Payer: Self-pay | Admitting: Emergency Medicine

## 2017-10-08 ENCOUNTER — Emergency Department (HOSPITAL_COMMUNITY): Payer: Medicare Other

## 2017-10-08 ENCOUNTER — Other Ambulatory Visit: Payer: Self-pay

## 2017-10-08 DIAGNOSIS — M542 Cervicalgia: Secondary | ICD-10-CM | POA: Diagnosis present

## 2017-10-08 DIAGNOSIS — R1011 Right upper quadrant pain: Secondary | ICD-10-CM | POA: Diagnosis not present

## 2017-10-08 DIAGNOSIS — M069 Rheumatoid arthritis, unspecified: Secondary | ICD-10-CM | POA: Insufficient documentation

## 2017-10-08 DIAGNOSIS — R0602 Shortness of breath: Secondary | ICD-10-CM | POA: Insufficient documentation

## 2017-10-08 DIAGNOSIS — R609 Edema, unspecified: Secondary | ICD-10-CM | POA: Insufficient documentation

## 2017-10-08 DIAGNOSIS — R10811 Right upper quadrant abdominal tenderness: Secondary | ICD-10-CM

## 2017-10-08 DIAGNOSIS — Z87891 Personal history of nicotine dependence: Secondary | ICD-10-CM | POA: Diagnosis not present

## 2017-10-08 DIAGNOSIS — M62838 Other muscle spasm: Secondary | ICD-10-CM | POA: Diagnosis not present

## 2017-10-08 DIAGNOSIS — Z7982 Long term (current) use of aspirin: Secondary | ICD-10-CM | POA: Insufficient documentation

## 2017-10-08 DIAGNOSIS — R0789 Other chest pain: Secondary | ICD-10-CM | POA: Insufficient documentation

## 2017-10-08 DIAGNOSIS — Z79899 Other long term (current) drug therapy: Secondary | ICD-10-CM | POA: Insufficient documentation

## 2017-10-08 LAB — CBC
HCT: 39.5 % (ref 36.0–46.0)
Hemoglobin: 13 g/dL (ref 12.0–15.0)
MCH: 28.7 pg (ref 26.0–34.0)
MCHC: 32.9 g/dL (ref 30.0–36.0)
MCV: 87.2 fL (ref 78.0–100.0)
PLATELETS: 206 10*3/uL (ref 150–400)
RBC: 4.53 MIL/uL (ref 3.87–5.11)
RDW: 14.5 % (ref 11.5–15.5)
WBC: 10.3 10*3/uL (ref 4.0–10.5)

## 2017-10-08 LAB — URINALYSIS, ROUTINE W REFLEX MICROSCOPIC
BACTERIA UA: NONE SEEN
Bilirubin Urine: NEGATIVE
Glucose, UA: NEGATIVE mg/dL
Hgb urine dipstick: NEGATIVE
Ketones, ur: 5 mg/dL — AB
NITRITE: NEGATIVE
Protein, ur: NEGATIVE mg/dL
SPECIFIC GRAVITY, URINE: 1.01 (ref 1.005–1.030)
pH: 6 (ref 5.0–8.0)

## 2017-10-08 LAB — BASIC METABOLIC PANEL
Anion gap: 12 (ref 5–15)
BUN: 6 mg/dL (ref 6–20)
CALCIUM: 9 mg/dL (ref 8.9–10.3)
CHLORIDE: 104 mmol/L (ref 101–111)
CO2: 22 mmol/L (ref 22–32)
CREATININE: 0.69 mg/dL (ref 0.44–1.00)
GFR calc Af Amer: >60 mL/min (ref >60)
Glucose, Bld: 106 mg/dL — ABNORMAL HIGH (ref 65–99)
Potassium: 3.5 mmol/L (ref 3.5–5.1)
SODIUM: 138 mmol/L (ref 135–145)

## 2017-10-08 LAB — I-STAT TROPONIN, ED
Troponin i, poc: 0 ng/mL (ref 0.00–0.08)
Troponin i, poc: 0.01 ng/mL (ref 0.00–0.08)

## 2017-10-08 LAB — BRAIN NATRIURETIC PEPTIDE: B NATRIURETIC PEPTIDE 5: 48.9 pg/mL (ref 0.0–100.0)

## 2017-10-08 LAB — HEPATIC FUNCTION PANEL
ALK PHOS: 76 U/L (ref 38–126)
ALT: 13 U/L — ABNORMAL LOW (ref 14–54)
AST: 20 U/L (ref 15–41)
Albumin: 3.4 g/dL — ABNORMAL LOW (ref 3.5–5.0)
Total Bilirubin: 0.2 mg/dL — ABNORMAL LOW (ref 0.3–1.2)
Total Protein: 7.3 g/dL (ref 6.5–8.1)

## 2017-10-08 LAB — LIPASE, BLOOD: Lipase: 23 U/L (ref 11–51)

## 2017-10-08 MED ORDER — MELOXICAM 15 MG PO TABS: 15.0000 mg | ORAL_TABLET | Freq: Every day | ORAL | 0 refills | Status: DC

## 2017-10-08 MED ORDER — CYCLOBENZAPRINE HCL 10 MG PO TABS: 10.0000 mg | ORAL_TABLET | Freq: Three times a day (TID) | ORAL | 0 refills | Status: DC | PRN

## 2017-10-08 MED ORDER — GI COCKTAIL ~~LOC~~
30.0000 mL | Freq: Once | ORAL | Status: AC
  Administered 2017-10-08: 30 mL via ORAL
  Filled 2017-10-08: qty 30

## 2017-10-08 MED ORDER — CYCLOBENZAPRINE HCL 10 MG PO TABS
10.0000 mg | ORAL_TABLET | Freq: Once | ORAL | Status: AC
  Administered 2017-10-08: 10 mg via ORAL
  Filled 2017-10-08: qty 1

## 2017-10-08 MED ORDER — FAMOTIDINE IN NACL 20-0.9 MG/50ML-% IV SOLN
20.0000 mg | Freq: Once | INTRAVENOUS | Status: AC
  Administered 2017-10-08: 20 mg via INTRAVENOUS
  Filled 2017-10-08: qty 50

## 2017-10-08 MED ORDER — RANITIDINE HCL 150 MG PO TABS: 150.0000 mg | ORAL_TABLET | Freq: Two times a day (BID) | ORAL | 0 refills | Status: DC

## 2017-10-08 NOTE — Discharge Instructions (Addendum)
Your neck/shoulder pain is likely from a muscle strain. Use mobic daily as directed (always on a full stomach), don't use additional NSAIDs like ibuprofen/aleve/etc while taking mobic. Use additional tylenol as needed for additional pain relief. Use heat to the area to help with pain. Take flexeril as directed as needed for muscle spasms, but don't drive or operate machinery while taking this medication.   Your chest and abdominal pain is likely from gastritis or an ulcer, and could also be from musculoskeletal pain of the chest wall. You will need to take zantac as directed, and avoid spicy/fatty/acidic foods, avoid soda/coffee/tea/alcohol. Avoid laying down flat within 30 minutes of eating. Avoid NSAIDs like ibuprofen/aleve/motrin/etc on an empty stomach. May consider using over the counter tums/maalox as needed for additional relief. Use tylenol as needed for pain.   Your leg swelling could be from a variety of issues, but today's work up has been reassuring. Elevate your legs, eat a low salt/sodium diet, and use compression stockings to help with this issue.   Follow up with your regular doctor in 5-7 days for recheck of symptoms. Return to the ER for changes or worsening symptoms.

## 2017-10-08 NOTE — ED Triage Notes (Signed)
Patient c/o left side neck and back pain x1 week. C/o constant central chest aching since yesterday.

## 2017-10-08 NOTE — ED Provider Notes (Signed)
[facility]-EMERGENCY DEPT Provider Note   CSN: [id number] Arrival date & time: 10/08/17  1250     History   Chief Complaint Chief Complaint  Patient presents with  . Chest Pain  . Neck Pain  . Back Pain    HPI Kristina Shannon is a 67 y.o. female with a PMHx of fibromyalgia, atypical chest pain with low-risk stress test 09/2016, lupus, RA, and other conditions listed below, who presents to the ED with a variety of complaints.  Of note, pt is a difficult historian due to being fairly grouchy and not wanting to answer questions very much, therefore level 5 caveat applies.  Her primary complaint is left-sided neck and shoulder pain for the last week, as well as right-sided chest pain underneath her breast that started yesterday although she is not clear on exactly what time yesterday it started.  Regarding her left neck and shoulder pain, she states that it feels like a muscle pain, describing it as 10/10 constant throbbing nonradiating pain in that area, worse with laying on her left side, and unrelieved with Tylenol and aspirin.  She states that her chest pain started sometime yesterday while she was at rest, feels like a stabbing nonradiating constant pain under her right breast.  She mentions feeling lightheaded with activity, having a dry cough recently, shortness of breath with exertion, and 6 pillow orthopnea recently which is up from 2 pillows.  She also mentions that last week she had bilateral leg swelling, went to her PCP and had a DVT U/S on both legs which was negative, she states that her swelling has gradually improved since then.  She states that she recently finished an antibiotic for UTI last week, which is when "all of her problems" started.  She is unsure of the name of the medication.  She mentions that she had a DVT in her legs many years ago and required anticoagulation, but after tying her tubes she no longer had DVTs and no longer needed anticoagulation.   She is a non-smoker.  She is a positive family history of "heart problems" in her mother and father but she cannot go into further detail, she thinks that both of them had MIs but she does not further elaborate on any of that.  She denies diaphoresis, hemoptysis, fevers, chills, SOB at rest, unilateral LE swelling, recent travel/surgery/immobilization, estrogen use, abd pain, N/V/D/C, hematuria, dysuria, focal arthralgias, claudication, numbness, tingling, focal weakness, or any other complaints at this time.   The history is provided by the patient and medical records. No language interpreter was used.    Past Medical History:  Diagnosis Date  . Atypical chest pain 09/08/2016  . Fibromyalgia   . Lupus (HCC)   . Osteoporosis   . RA (rheumatoid arthritis) (HCC)   . Shortness of breath 09/08/2016    Patient Active Problem List   Diagnosis Date Noted  . Atypical chest pain 09/08/2016  . Shortness of breath 09/08/2016  . Lupus (HCC)   . Fibromyalgia   . Osteoporosis   . RA (rheumatoid arthritis) (HCC)     Past Surgical History:  Procedure Laterality Date  . ABDOMINAL HYSTERECTOMY    . BILATERAL CARPAL TUNNEL RELEASE    . EYE SURGERY Bilateral   . TUBAL LIGATION       OB History    Gravida  6   Para  3   Term      Preterm      AB  3  Living  3     SAB  1   TAB  2   Ectopic      Multiple      Live Births               Home Medications    Prior to Admission medications   Medication Sig Start Date End Date Taking? Authorizing Provider  Abatacept (ORENCIA CLICKJECT) 125 MG/ML SOAJ Inject 125 mg into the skin every Friday.     [provider]  acetaminophen (TYLENOL) 325 MG tablet Take 650 mg by mouth every 6 (six) hours as needed.    [provider]  acetaminophen (TYLENOL) 500 MG tablet Take 500-1,000 mg by mouth every 6 (six) hours as needed (for pain).    [provider]  aspirin EC 325 MG tablet Take 325-650 mg by mouth once  as needed (for chest pain).    [provider]  docusate sodium (COLACE) 100 MG capsule Take 1 capsule (100 mg total) by mouth 2 (two) times daily as needed. Patient not taking: Reported on 09/07/2017 07/07/16   Reva Bores, MD  escitalopram (LEXAPRO) 10 MG tablet Take 10 mg by mouth daily.    [provider]  folic acid (FOLVITE) 1 MG tablet Take 1 mg by mouth daily.    [provider]  hydrochlorothiazide (HYDRODIURIL) 25 MG tablet Take 25 mg by mouth daily.    [provider]  leflunomide (ARAVA) 20 MG tablet Take 20 mg by mouth daily.    [provider]  linaclotide (LINZESS) 290 MCG CAPS capsule Take 290 mcg by mouth daily as needed (for constipation).    [provider]  Methotrexate, PF, 25 MG/0.4ML SOAJ Inject 25 mg into the skin once a week.    [provider]  pantoprazole (PROTONIX) 40 MG tablet Take 40 mg by mouth daily before breakfast.    [provider]  predniSONE (DELTASONE) 5 MG tablet Take 5 mg by mouth daily with breakfast.    [provider]    Family History Family History  Problem Relation Age of Onset  . Congenital heart disease Mother   . Stroke Mother   . Blindness Mother   . COPD Mother   . Heart failure Mother   . Leukemia Father   . Acute lymphoblastic leukemia Father   . Heart disease Father   . Heart attack Father   . Lung cancer Brother   . Thyroid disease Brother   . Heart attack Brother   . Multiple sclerosis Brother   . Thyroid disease Other   . Breast cancer Daughter 54    Social History Social History   Tobacco Use  . Smoking status: Former Smoker    Last attempt to quit: 04/01/2015    Years since quitting: 2.5  . Smokeless tobacco: Never Used  Substance Use Topics  . Alcohol use: Not on file  . Drug use: Not on file     Allergies   Augmentin [amoxicillin-pot clavulanate]   Review of Systems Review of Systems  Constitutional: Negative for chills,  diaphoresis and fever.  Respiratory: Positive for cough (dry) and shortness of breath (with exertion only).        +6 pillow orthopnea (up from 2)  Cardiovascular: Positive for chest pain and leg swelling (improving, no unilateral swelling).  Gastrointestinal: Negative for abdominal pain, constipation, diarrhea, nausea and vomiting.  Genitourinary: Negative for dysuria and hematuria.  Musculoskeletal: Positive for myalgias and neck pain. Negative  for arthralgias.  Skin: Negative for color change.  Allergic/Immunologic: Positive for immunocompromised state (on MTX).  Neurological: Positive for light-headedness (with activity). Negative for weakness and numbness.  Psychiatric/Behavioral: Negative for confusion.   All other systems reviewed and are negative for acute change except as noted in the HPI.    Physical Exam Updated Vital Signs BP (!) 148/110 (BP Location: Left Arm)   Pulse 99   Temp 97.9 F (36.6 C) (Oral)   Resp 20   Ht 5\' 2"  (1.575 m)   Wt 95.3 kg (210 lb)   SpO2 94%   BMI 38.41 kg/m   Physical Exam  Constitutional: She is oriented to person, place, and time. Vital signs are normal. She appears well-developed and well-nourished.  Non-toxic appearance. No distress.  Afebrile, nontoxic, NAD but very grouchy and difficult to evaluate  HENT:  Head: Normocephalic and atraumatic.  Mouth/Throat: Oropharynx is clear and moist and mucous membranes are normal.  Eyes: Conjunctivae and EOM are normal. Right eye exhibits no discharge. Left eye exhibits no discharge.  Neck: Normal range of motion. Neck supple. Muscular tenderness present. No spinous process tenderness present. No neck rigidity. Normal range of motion present.  FROM intact without spinous process TTP, no bony stepoffs or deformities, with mild L sided paraspinous muscle TTP and muscle spasms extending into the trapezius. No rigidity or meningeal signs. No bruising or swelling.   Cardiovascular: Normal rate, regular  rhythm, normal heart sounds and intact distal pulses. Exam reveals no gallop and no friction rub.  No murmur heard. HR 90s during entirety of exam; RRR, nl s1/s2, no m/r/g, distal pulses intact, 1-2+ b/l pedal edema   Pulmonary/Chest: Effort normal and breath sounds normal. No respiratory distress. She has no decreased breath sounds. She has no wheezes. She has no rhonchi. She has no rales. She exhibits tenderness. She exhibits no crepitus, no deformity and no retraction.  CTAB in all lung fields, no w/r/r, no hypoxia or increased WOB, speaking in full sentences, SpO2 97-98% on RA during entirety of exam Chest wall with diffuse R lateral rib cage margin TTP, without crepitus, deformities, or retractions     Abdominal: Soft. Normal appearance and bowel sounds are normal. She exhibits no distension. There is tenderness in the right upper quadrant and epigastric area. There is no rigidity, no rebound, no guarding, no CVA tenderness, no tenderness at McBurney's point and negative Murphy's sign.  Soft, obese but nondistended, +BS throughout, with diffuse upper abd TTP but most focally in the epigastrum and RUQ, no r/g/r, neg murphy's although this exam elicits pain, neg mcburney's, no CVA TTP   Musculoskeletal: Normal range of motion.  MAE x4 Strength and sensation grossly intact in all extremities Distal pulses intact 1-2+ b/l pedal edema  Neurological: She is alert and oriented to person, place, and time. She has normal strength. No sensory deficit.  Skin: Skin is warm, dry and intact. No rash noted.  Psychiatric: Her affect is angry.  Somewhat grouchy/angry  Nursing note and vitals reviewed.    ED Treatments / Results  Labs (all labs ordered are listed, but only abnormal results are displayed) Labs Reviewed  BASIC METABOLIC PANEL - Abnormal; Notable for the following components:      Result Value   Glucose, Bld 106 (*)    All other components within normal limits  HEPATIC FUNCTION PANEL  - Abnormal; Notable for the following components:   Albumin 3.4 (*)    ALT 13 (*)    Total  Bilirubin 0.2 (*)    Bilirubin, Direct <0.1 (*)    All other components within normal limits  URINALYSIS, ROUTINE W REFLEX MICROSCOPIC - Abnormal; Notable for the following components:   Ketones, ur 5 (*)    Leukocytes, UA TRACE (*)    All other components within normal limits  CBC  BRAIN NATRIURETIC PEPTIDE  LIPASE, BLOOD  I-STAT TROPONIN, ED  I-STAT TROPONIN, ED    EKG EKG Interpretation  Date/Time:  Sunday Oct 08 2017 12:58:55 EDT Ventricular Rate:  103 PR Interval:    QRS Duration: 73 QT Interval:  346 QTC Calculation: 453 R Axis:   25 Text Interpretation:  Sinus tachycardia Low voltage, precordial leads Baseline wander in lead(s) V5 Confirmed by Lorre Nick ([id number]) on 10/08/2017 2:36:05 PM   Radiology Dg Chest 2 View  Result Date: 10/08/2017 CLINICAL DATA:  LEFT-sided neck pain EXAM: CHEST - 2 VIEW COMPARISON:  None. FINDINGS: Normal mediastinum and cardiac silhouette. Normal pulmonary vasculature. No evidence of effusion, infiltrate, or pneumothorax. No acute bony abnormality. IMPRESSION: Normal chest radiograph Electronically Signed   By: Genevive Bi M.D.   On: 10/08/2017 13:37   US Abdomen Limited Ruq  Result Date: 10/08/2017 CLINICAL DATA:  Right upper quadrant pain EXAM: ULTRASOUND ABDOMEN LIMITED RIGHT UPPER QUADRANT COMPARISON:  None. FINDINGS: Gallbladder: No gallstones or wall thickening visualized. No sonographic Murphy sign noted by sonographer. Common bile duct: Diameter: Normal caliber, 4 mm Liver: Increased echotexture compatible with fatty infiltration. No focal abnormality or biliary ductal dilatation. Portal vein is patent on color Doppler imaging with normal direction of blood flow towards the liver. IMPRESSION: Fatty infiltration of the liver. No cholelithiasis or evidence of acute cholecystitis. Electronically Signed   By: Charlett Nose M.D.   On: 10/08/2017  17:39    BLE DVT U/S 10/02/17: IMPRESSION: No evidence of DVT within either lower extremity. Electronically Signed   By: Simonne Come M.D.   On: 10/02/2017 14:43   Echo 09/26/16: Study Conclusions - Left ventricle: The cavity size was normal. Wall thickness was   normal. Systolic function was normal. The estimated ejection   fraction was in the range of 55% to 60%. Wall motion was normal;   there were no regional wall motion abnormalities. Doppler   parameters are consistent with abnormal left ventricular   relaxation (grade 1 diastolic dysfunction). - Mitral valve: There was mild regurgitation. Impressions: - Normal LV systolic function; mild diastolic dysfunction; mild MR   and TR.   09/23/16 Myoview Stress Test: Study Highlights:    The left ventricular ejection fraction is hyperdynamic (>65%).  Nuclear stress EF: 66%.  Blood pressure demonstrated a normal response to exercise.  No T wave inversion was noted during stress.  There was no ST segment deviation noted during stress.  This is a low risk study.   Normal perfusion. LVEF 66% with normal wall motion. This is a low risk study.     Procedures Procedures (including critical care time)  Medications Ordered in ED Medications  gi cocktail (Maalox,Lidocaine,Donnatal) (30 mLs Oral Given 10/08/17 1537)  famotidine (PEPCID) IVPB 20 mg premix (0 mg Intravenous Stopped 10/08/17 1700)  cyclobenzaprine (FLEXERIL) tablet 10 mg (10 mg Oral Given 10/08/17 1533)     Initial Impression / Assessment and Plan / ED Course  I have reviewed the triage vital signs and the nursing notes.  Pertinent labs & imaging results that were available during my care of the patient were reviewed by me and considered in my medical  decision making (see chart for details).     67 y.o. female here with a variety of complaints, and pt is fairly grouchy and very difficult to get a story from. Primary complaints are of R chest pain x1 day and L  neck/shoulder pain x1wk. Also states she's had some BLE swelling since last week but that's been improving, had DVT U/S on both legs which were negative. On exam, 1-2+ b/l pitting edema which is symmetric, SpO2 97-98% during entirety of exam, no tachycardia at any point in time during exam (HR 90s), clear lungs, diffuse R lateral rib TTP along costal margin, diffuse upper abd TTP most focally in RUQ with neg murphy's although this elicits pain, nonperitoneal. Diffuse L paracervical muscle TTP and spasm, no midline spinal TTP. Work up thus far reveals: EKG without ischemic findings; trop neg; BMP WNL; CBC WNL; CXR neg. Neck bain is likely musculoskeletal, will give flexeril; CP seems more RUQ pain, will get lipase, LFTs, U/A, and RUQ U/S. Given recent neg DVT study of the legs and with no hypoxia or tachycardia, doubt PE; will get BNP and second trop at 3hr mark. Will give GI cocktail and pepcid then reassess shortly.   6:19 PM Lipase WNL. LFTs unremarkable. U/A without evidence of UTI. BNP WNL, doubt CHF. Second trop negative. RUQ U/S with fatty liver infiltration but otherwise negative for cholelithiasis/cholecystitis. Pt ambulating without difficulty here. Overall reassuring work up. Her neck pain is likely from musculoskeletal etiology and muscle spasm, will send home with flexeril and mobic, advised tylenol/heat use. Her chest/abd pain could be musculoskeletal vs gastritis/GERD/PUD related. Discussed diet/lifestyle modifications for symptoms, will start on zantac, advised tylenol use and avoidance/sparing use of NSAIDs only on full stomach, discussed other OTC remedies for symptomatic relief, and f/up with PCP in 5-7 days for recheck of symptoms and ongoing evaluation/management. As for her peripheral edema, this is not a new issue, advised compression stockings, DASH diet, and elevation of legs, and f/up with PCP for ongoing management of this issue. Doubt need for further work up of any of her conditions at  this time. I explained the diagnosis and have given explicit precautions to return to the ER including for any other new or worsening symptoms. The patient understands and accepts the medical plan as it's been dictated and I have answered their questions. Discharge instructions concerning home care and prescriptions have been given. The patient is STABLE and is discharged to home in good condition.    Final Clinical Impressions(s) / ED Diagnoses   Final diagnoses:  RUQ abdominal tenderness  Neck pain  Muscle spasms of neck  Atypical chest pain  RUQ abdominal pain  SOB (shortness of breath) on exertion  Peripheral edema    ED Discharge Orders        Ordered    cyclobenzaprine (FLEXERIL) 10 MG tablet  3 times daily PRN     10/08/17 1819    meloxicam (MOBIC) 15 MG tablet  Daily     10/08/17 1819    ranitidine (ZANTAC) 150 MG tablet  2 times daily     10/08/17 [address] [place], [place] 10/08/17 1819    Lorre Nick, MD 10/10/17 [phone]

## 2017-11-10 ENCOUNTER — Ambulatory Visit: Payer: Medicare Other | Admitting: Cardiovascular Disease

## 2017-12-28 ENCOUNTER — Other Ambulatory Visit: Payer: Self-pay | Admitting: Family Medicine

## 2017-12-28 DIAGNOSIS — R946 Abnormal results of thyroid function studies: Secondary | ICD-10-CM

## 2018-01-03 ENCOUNTER — Ambulatory Visit
Admission: RE | Admit: 2018-01-03 | Discharge: 2018-01-03 | Disposition: A | Discharge status: Home / Self Care | Payer: Medicare Other | Source: Ambulatory Visit | Attending: Family Medicine | Admitting: Family Medicine

## 2018-01-03 DIAGNOSIS — R946 Abnormal results of thyroid function studies: Secondary | ICD-10-CM

## 2018-06-28 ENCOUNTER — Ambulatory Visit: Payer: Medicare Other | Admitting: Cardiovascular Disease

## 2018-07-10 ENCOUNTER — Ambulatory Visit: Payer: Medicare Other | Admitting: Cardiology

## 2018-07-24 ENCOUNTER — Ambulatory Visit (INDEPENDENT_AMBULATORY_CARE_PROVIDER_SITE_OTHER): Payer: Medicare Other | Admitting: Cardiology

## 2018-07-24 ENCOUNTER — Encounter: Payer: Self-pay | Admitting: Cardiology

## 2018-07-24 ENCOUNTER — Other Ambulatory Visit: Payer: Self-pay

## 2018-07-24 VITALS — BP 134/88 | HR 81 | Ht 62.0 in | Wt 215.0 lb

## 2018-07-24 DIAGNOSIS — I739 Peripheral vascular disease, unspecified: Secondary | ICD-10-CM | POA: Insufficient documentation

## 2018-07-24 DIAGNOSIS — M797 Fibromyalgia: Secondary | ICD-10-CM

## 2018-07-24 DIAGNOSIS — R079 Chest pain, unspecified: Secondary | ICD-10-CM

## 2018-07-24 DIAGNOSIS — G4730 Sleep apnea, unspecified: Secondary | ICD-10-CM | POA: Diagnosis not present

## 2018-07-24 DIAGNOSIS — M79662 Pain in left lower leg: Secondary | ICD-10-CM

## 2018-07-24 DIAGNOSIS — M79661 Pain in right lower leg: Secondary | ICD-10-CM

## 2018-07-24 DIAGNOSIS — R0609 Other forms of dyspnea: Secondary | ICD-10-CM

## 2018-07-24 NOTE — Progress Notes (Signed)
07/24/2018 Kristina Shannon   1950/06/06  [id number]  Primary Physician Henrine Screws, MD Primary Cardiologist: Dr Duke Salvia  HPI: Patient is a pleasant 68 year old female seen by Dr. Duke Salvia in the past.  She came in the office today for "checkup".  We have not seen her since 2018.  The patient has a history of atypical chest pain and dyspnea.  She had a low risk Myoview and essentially normal echocardiogram in 2018.  She has some type of fibromyalgia.  Patient initially said she was fine and had no complaints.  But the more I was in there the more became clear that she has dyspnea on exertion and exertional chest tightness.  She also complains of pain in her calfs when she walks and was reading our PAD signs and symptoms chart and felt like she had many of those.  Initially she declined all testing saying she was afraid that she would pick up the flu and did not want to be out in public.  Later she changed her mind and agreed to come back for further testing.   Current Outpatient Medications  Medication Sig Dispense Refill  . Abatacept (ORENCIA CLICKJECT) 125 MG/ML SOAJ Inject 125 mg into the skin every Friday.     . leflunomide (ARAVA) 20 MG tablet Take 20 mg by mouth daily.    . meloxicam (MOBIC) 15 MG tablet Take 1 tablet (15 mg total) by mouth daily. TAKE WITH MEALS 30 tablet 0   No current facility-administered medications for this visit.     Allergies  Allergen Reactions  . Augmentin [Amoxicillin-Pot Clavulanate] Diarrhea    Past Medical History:  Diagnosis Date  . Atypical chest pain 09/08/2016  . Fibromyalgia   . Lupus (HCC)   . Osteoporosis   . RA (rheumatoid arthritis) (HCC)   . Shortness of breath 09/08/2016    Social History   Socioeconomic History  . Marital status: Married    Spouse name: Not on file  . Number of children: Not on file  . Years of education: Not on file  . Highest education level: Not on file  Occupational History  . Not on file  Social  Needs  . Financial resource strain: Not on file  . Food insecurity:    Worry: Not on file    Inability: Not on file  . Transportation needs:    Medical: Not on file    Non-medical: Not on file  Tobacco Use  . Smoking status: Former Smoker    Last attempt to quit: 04/01/2015    Years since quitting: 3.3  . Smokeless tobacco: Never Used  Substance and Sexual Activity  . Alcohol use: Not on file  . Drug use: Not on file  . Sexual activity: Not on file  Lifestyle  . Physical activity:    Days per week: Not on file    Minutes per session: Not on file  . Stress: Not on file  Relationships  . Social connections:    Talks on phone: Not on file    Gets together: Not on file    Attends religious service: Not on file    Active member of club or organization: Not on file    Attends meetings of clubs or organizations: Not on file    Relationship status: Not on file  . Intimate partner violence:    Fear of current or ex partner: Not on file    Emotionally abused: Not on file    Physically abused: Not on  file    Forced sexual activity: Not on file  Other Topics Concern  . Not on file  Social History Narrative  . Not on file     Family History  Problem Relation Age of Onset  . Congenital heart disease Mother   . Stroke Mother   . Blindness Mother   . COPD Mother   . Heart failure Mother   . Leukemia Father   . Acute lymphoblastic leukemia Father   . Heart disease Father   . Heart attack Father   . Lung cancer Brother   . Thyroid disease Brother   . Heart attack Brother   . Multiple sclerosis Brother   . Thyroid disease Other   . Breast cancer Daughter 27     Review of Systems: General: negative for chills, fever, night sweats or weight changes.  Cardiovascular: negative for orthopnea, palpitations, paroxysmal nocturnal dyspnea  Dermatological: negative for rash Respiratory: negative for cough or wheezing Urologic: negative for hematuria Abdominal: negative for  nausea, vomiting, diarrhea, bright red blood per rectum, melena, or hematemesis Neurologic: negative for visual changes, syncope, or dizziness All other systems reviewed and are otherwise negative except as noted above.    Blood pressure 134/88, pulse 81, height 5\' 2"  (1.575 m), weight 215 lb (97.5 kg), SpO2 99 %.  General appearance: alert, cooperative, no distress and moderately obese Neck: no carotid bruit and no JVD Lungs: clear to auscultation bilaterally Heart: regular rate and rhythm Extremities: chronic LE edema-warm to touch Pulses: 2+ and symmetric diminnished pedal and posterior tibial pulses Skin: warm and dry Neurologic: Grossly normal  EKG NSR, low voltage, PVC  ASSESSMENT AND PLAN:   DOE (dyspnea on exertion) This is not a new issue- she declined further work up  Chest pain with moderate risk of acute coronary syndrome Low risk Myoview 2018, normal LVF by echo 2018  Fibromyalgia Followed by PCP  Sleep apnea Non complaint  Claudication (HCC) C/O bilateral calf pain with walking-decreased pulses on exam   PLAN the patient initially denied any symptoms but then admitted she had several symptoms.  She initially did not want a work-up but later decided to proceed.  I am going to order a [employer], echocardiogram, lower extremity arterial Dopplers.  Follow-up will be pending the studies.  Corine Shelter PA-C 07/24/2018 9:32 AM

## 2018-07-24 NOTE — Assessment & Plan Note (Signed)
Low risk Myoview 2018, normal LVF by echo 2018

## 2018-07-24 NOTE — Assessment & Plan Note (Signed)
C/O bilateral calf pain with walking-decreased pulses on exam

## 2018-07-24 NOTE — Assessment & Plan Note (Signed)
Non complaint 

## 2018-07-24 NOTE — Assessment & Plan Note (Signed)
This is not a new issue- she declined further work up

## 2018-07-24 NOTE — Patient Instructions (Addendum)
Medication Instructions:  Your physician recommends that you continue on your current medications as directed. Please refer to the Current Medication list given to you today. If you need a refill on your cardiac medications before your next appointment, please call your pharmacy.   Lab work: NONE  If you have labs (blood work) drawn today and your tests are completely normal, you will receive your results only by: Marland Kitchen MyChart Message (if you have MyChart) OR . A paper copy in the mail If you have any lab test that is abnormal or we need to change your treatment, we will call you to review the results.  Testing/Procedures: Your physician has requested that you have a lower extremity arterial exercise duplex. During this test, exercise and ultrasound are used to evaluate arterial blood flow in the legs. Allow one hour for this exam. There are no restrictions or special instructions. [facility] OFFICE  Your physician has requested that you have an ankle brachial index (ABI). During this test an ultrasound and blood pressure cuff are used to evaluate the arteries that supply the arms and legs with blood. Allow thirty minutes for this exam. There are no restrictions or special instructions. [facility] OFFICE  Your physician has requested that you have a lexiscan myoview. For further information please visit [email or web address] Please follow instruction sheet, as given. [facility] OFFICE  Your physician has requested that you have an echocardiogram. Echocardiography is a painless test that uses sound waves to create images of your heart. It provides your doctor with information about the size and shape of your heart and how well your heart's chambers and valves are working. This procedure takes approximately one hour. There are no restrictions for this procedure. [address]  Follow-Up: At [facility], you and your health needs are our priority.  As part of our continuing mission  to provide you with exceptional heart care, we have created designated Provider Care Teams.  These Care Teams include your primary Cardiologist (physician) and Advanced Practice Providers (APPs -  Physician Assistants and Nurse Practitioners) who all work together to provide you with the care you need, when you need it. You will need a follow up appointment in 12 months.  Please call our office 2 months in advance to schedule this appointment.  You may see DR [facility] or one of the following Advanced Practice Providers on your designated Care Team:   Corine Shelter, PA-C Judy Pimple, [place] . Marjie Skiff, PA-C  Any Other Special Instructions Will Be Listed Below (If Applicable). WE WILL CONTACT YOU WITH TEST RESULTS AND IF A SOONER APPOINTMENT IS NEEDED.

## 2018-07-24 NOTE — Assessment & Plan Note (Signed)
Followed by PCP

## 2018-08-02 ENCOUNTER — Other Ambulatory Visit (HOSPITAL_COMMUNITY): Payer: Medicare Other

## 2018-08-03 ENCOUNTER — Encounter (HOSPITAL_COMMUNITY): Payer: Medicare Other

## 2018-08-07 ENCOUNTER — Other Ambulatory Visit: Payer: Self-pay | Admitting: Cardiology

## 2018-08-07 DIAGNOSIS — M797 Fibromyalgia: Secondary | ICD-10-CM

## 2018-08-07 DIAGNOSIS — M79661 Pain in right lower leg: Secondary | ICD-10-CM

## 2018-08-07 DIAGNOSIS — R079 Chest pain, unspecified: Secondary | ICD-10-CM

## 2018-08-07 DIAGNOSIS — G4730 Sleep apnea, unspecified: Secondary | ICD-10-CM

## 2018-08-07 DIAGNOSIS — I739 Peripheral vascular disease, unspecified: Secondary | ICD-10-CM

## 2018-08-07 DIAGNOSIS — M79662 Pain in left lower leg: Secondary | ICD-10-CM

## 2018-08-23 ENCOUNTER — Encounter (HOSPITAL_COMMUNITY): Payer: Medicare Other

## 2018-10-18 ENCOUNTER — Telehealth: Payer: Self-pay | Admitting: Cardiovascular Disease

## 2018-10-18 NOTE — Telephone Encounter (Signed)
Spoke with Pt. She report she was supposed to have a lexiscan and ECHO but cancelled as she thought her insurance wouldn't pay for it. Pt is now wanting to reschedule. Informed pt that Nurse will send message to schedulers to reschedule. Pt voiced understanding.

## 2018-10-18 NOTE — Telephone Encounter (Signed)
New Message            Patient is calling about her appointment she is having on 06/04, she has questions. Pls call to advise.

## 2018-10-18 NOTE — Telephone Encounter (Signed)
Follow up  ° ° °Pt is returning call  ° ° °Please call back  °

## 2018-10-18 NOTE — Telephone Encounter (Signed)
Left message to call back  

## 2018-10-18 NOTE — Telephone Encounter (Signed)
Message sent to [facility] scheduling 

## 2018-10-19 ENCOUNTER — Telehealth: Payer: Self-pay | Admitting: Cardiovascular Disease

## 2018-10-19 NOTE — Telephone Encounter (Signed)
Called patient, advised that a message was sent to our scheduling department per [facility] last note. Advised patient they would contact her as soon as they had opening to get her down, but the orders were placed.  Patient had no concerns at this time.

## 2018-10-19 NOTE — Telephone Encounter (Signed)
New message:   Patient calling to get a order to have a Myocardial Perfusion and a ECHO

## 2018-10-22 NOTE — Telephone Encounter (Signed)
Follow up   Patient is returning call. She states someone called her today.

## 2018-10-25 ENCOUNTER — Other Ambulatory Visit: Payer: Self-pay

## 2018-10-25 ENCOUNTER — Ambulatory Visit (HOSPITAL_COMMUNITY)
Admission: RE | Admit: 2018-10-25 | Discharge: 2018-10-25 | Disposition: A | Discharge status: Home / Self Care | Payer: Medicare Other | Source: Ambulatory Visit | Attending: Cardiology | Admitting: Cardiology

## 2018-10-25 DIAGNOSIS — M79662 Pain in left lower leg: Secondary | ICD-10-CM

## 2018-10-25 DIAGNOSIS — I739 Peripheral vascular disease, unspecified: Secondary | ICD-10-CM | POA: Diagnosis not present

## 2018-10-25 DIAGNOSIS — M79661 Pain in right lower leg: Secondary | ICD-10-CM | POA: Diagnosis not present

## 2018-10-31 ENCOUNTER — Ambulatory Visit (HOSPITAL_COMMUNITY)
Admission: RE | Admit: 2018-10-31 | Payer: Medicare Other | Source: Ambulatory Visit | Attending: Cardiology | Admitting: Cardiology

## 2018-10-31 ENCOUNTER — Telehealth (HOSPITAL_COMMUNITY): Payer: Self-pay | Admitting: Radiology

## 2018-10-31 NOTE — Telephone Encounter (Signed)

## 2018-11-01 ENCOUNTER — Other Ambulatory Visit: Payer: Self-pay

## 2018-11-01 ENCOUNTER — Ambulatory Visit (HOSPITAL_COMMUNITY): Payer: Medicare Other | Attending: Cardiovascular Disease

## 2018-11-01 DIAGNOSIS — R079 Chest pain, unspecified: Secondary | ICD-10-CM

## 2018-11-01 DIAGNOSIS — M797 Fibromyalgia: Secondary | ICD-10-CM | POA: Diagnosis not present

## 2018-11-01 DIAGNOSIS — G4730 Sleep apnea, unspecified: Secondary | ICD-10-CM | POA: Diagnosis not present

## 2018-11-01 DIAGNOSIS — R0609 Other forms of dyspnea: Secondary | ICD-10-CM

## 2018-11-06 ENCOUNTER — Telehealth: Payer: Self-pay | Admitting: Cardiovascular Disease

## 2018-11-06 NOTE — Telephone Encounter (Signed)
New Message: ° ° ° °Patient calling for Echo results  °

## 2018-11-06 NOTE — Telephone Encounter (Signed)
Pt advised Echo results. 

## 2018-11-15 ENCOUNTER — Other Ambulatory Visit: Payer: Self-pay | Admitting: Family Medicine

## 2018-11-15 DIAGNOSIS — Z1231 Encounter for screening mammogram for malignant neoplasm of breast: Secondary | ICD-10-CM

## 2018-11-16 ENCOUNTER — Other Ambulatory Visit: Payer: Self-pay

## 2018-11-16 ENCOUNTER — Ambulatory Visit (HOSPITAL_COMMUNITY)
Admission: RE | Admit: 2018-11-16 | Discharge: 2018-11-16 | Disposition: A | Discharge status: Home / Self Care | Payer: Medicare Other | Source: Ambulatory Visit | Attending: Cardiology | Admitting: Cardiology

## 2018-11-16 DIAGNOSIS — M797 Fibromyalgia: Secondary | ICD-10-CM | POA: Diagnosis not present

## 2018-11-16 DIAGNOSIS — R079 Chest pain, unspecified: Secondary | ICD-10-CM

## 2018-11-16 DIAGNOSIS — G4730 Sleep apnea, unspecified: Secondary | ICD-10-CM | POA: Diagnosis not present

## 2018-11-16 DIAGNOSIS — R0609 Other forms of dyspnea: Secondary | ICD-10-CM | POA: Diagnosis not present

## 2018-11-16 LAB — MYOCARDIAL PERFUSION IMAGING
LV dias vol: 79 mL (ref 46–106)
LV sys vol: 29 mL
Peak HR: 122 {beats}/min
Rest HR: 60 {beats}/min
SDS: 5
SRS: 0
SSS: 5
TID: 1.19

## 2018-11-16 MED ORDER — TECHNETIUM TC 99M TETROFOSMIN IV KIT
28.0000 | PACK | Freq: Once | INTRAVENOUS | Status: AC | PRN
  Administered 2018-11-16: 28 via INTRAVENOUS
  Filled 2018-11-16: qty 28

## 2018-11-16 MED ORDER — REGADENOSON 0.4 MG/5ML IV SOLN
0.4000 mg | Freq: Once | INTRAVENOUS | Status: AC
  Administered 2018-11-16: 0.4 mg via INTRAVENOUS

## 2018-11-16 MED ORDER — TECHNETIUM TC 99M TETROFOSMIN IV KIT
10.7000 | PACK | Freq: Once | INTRAVENOUS | Status: AC | PRN
  Administered 2018-11-16: 10.7 via INTRAVENOUS
  Filled 2018-11-16: qty 11

## 2018-11-20 ENCOUNTER — Encounter: Payer: Self-pay | Admitting: *Deleted

## 2018-11-20 NOTE — Telephone Encounter (Signed)
Spoke with pt, aware of myoview, echo and LEA results.

## 2018-11-20 NOTE — Telephone Encounter (Signed)
New Message            Patient is calling for Stress Test Results, pls call to advise

## 2018-12-31 ENCOUNTER — Ambulatory Visit
Admission: RE | Admit: 2018-12-31 | Discharge: 2018-12-31 | Disposition: A | Discharge status: Home / Self Care | Payer: Medicare Other | Source: Ambulatory Visit | Attending: Family Medicine | Admitting: Family Medicine

## 2018-12-31 ENCOUNTER — Other Ambulatory Visit: Payer: Self-pay

## 2018-12-31 DIAGNOSIS — Z1231 Encounter for screening mammogram for malignant neoplasm of breast: Secondary | ICD-10-CM

## 2019-05-29 ENCOUNTER — Other Ambulatory Visit: Payer: Self-pay | Admitting: Otolaryngology

## 2019-05-29 DIAGNOSIS — K219 Gastro-esophageal reflux disease without esophagitis: Secondary | ICD-10-CM

## 2019-06-17 ENCOUNTER — Ambulatory Visit
Admission: RE | Admit: 2019-06-17 | Discharge: 2019-06-17 | Disposition: A | Discharge status: Home / Self Care | Payer: Medicare Other | Source: Ambulatory Visit | Attending: Otolaryngology | Admitting: Otolaryngology

## 2019-06-17 DIAGNOSIS — K219 Gastro-esophageal reflux disease without esophagitis: Secondary | ICD-10-CM

## 2019-08-02 ENCOUNTER — Ambulatory Visit: Payer: Medicare Other

## 2019-08-03 ENCOUNTER — Ambulatory Visit: Payer: Medicare Other | Attending: Internal Medicine

## 2019-08-03 DIAGNOSIS — Z23 Encounter for immunization: Secondary | ICD-10-CM

## 2019-08-03 NOTE — Progress Notes (Signed)
Covid-19 Vaccination Clinic  Name:  Kristina Shannon    MRN: [id number] DOB: [birth year]  08/03/2019  [name] was observed post Covid-19 immunization for 15 minutes without incident. She was provided with Vaccine Information Sheet and instruction to access the V-Safe system.   [name] was instructed to call 911 with any severe reactions post vaccine: Marland Kitchen Difficulty breathing  . Swelling of face and throat  . A fast heartbeat  . A bad rash all over body  . Dizziness and weakness   Immunizations Administered    Name Date Dose VIS Date Route   Pfizer COVID-19 Vaccine 08/03/2019  1:38 PM 0.3 mL 05/03/2019 Intramuscular   Manufacturer: [employer], [employer]   Lot: [id number]   NDC: [address]-2

## 2019-08-27 ENCOUNTER — Ambulatory Visit: Payer: Medicare Other | Attending: Internal Medicine

## 2019-08-27 DIAGNOSIS — Z23 Encounter for immunization: Secondary | ICD-10-CM

## 2019-08-27 NOTE — Progress Notes (Signed)
Covid-19 Vaccination Clinic  Name:  Kristina Shannon    MRN: [id number] DOB: 1951-02-17  08/27/2019  [name] was observed post Covid-19 immunization for 15 minutes without incident. She was provided with Vaccine Information Sheet and instruction to access the V-Safe system.   [name] was instructed to call 911 with any severe reactions post vaccine: Marland Kitchen Difficulty breathing  . Swelling of face and throat  . A fast heartbeat  . A bad rash all over body  . Dizziness and weakness   Immunizations Administered    Name Date Dose VIS Date Route   Pfizer COVID-19 Vaccine 08/27/2019  1:18 PM 0.3 mL 05/03/2019 Intramuscular   Manufacturer: [employer], [employer]   Lot: [id number]   NDC: [address]-1

## 2019-09-08 IMAGING — MG DIGITAL SCREENING BILATERAL MAMMOGRAM WITH TOMO AND CAD
8 series · 8 of 24 positions shown · non-contrast
Comparison: Previous exam(s).

CLINICAL DATA: Screening.

EXAM:
DIGITAL SCREENING BILATERAL MAMMOGRAM WITH TOMO AND CAD

[L MLO synth-2D]
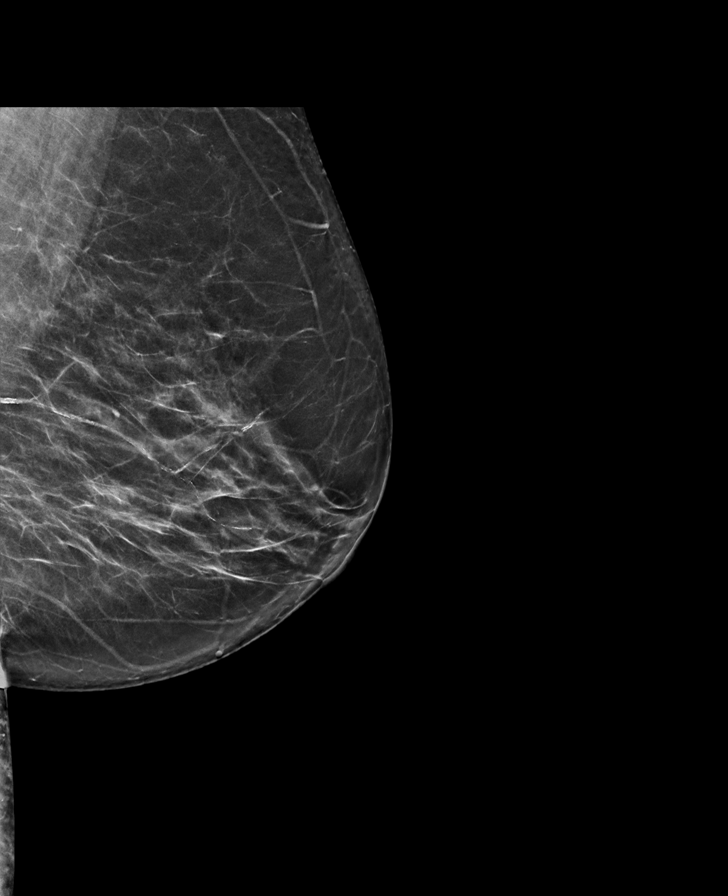

[L CC synth-2D]
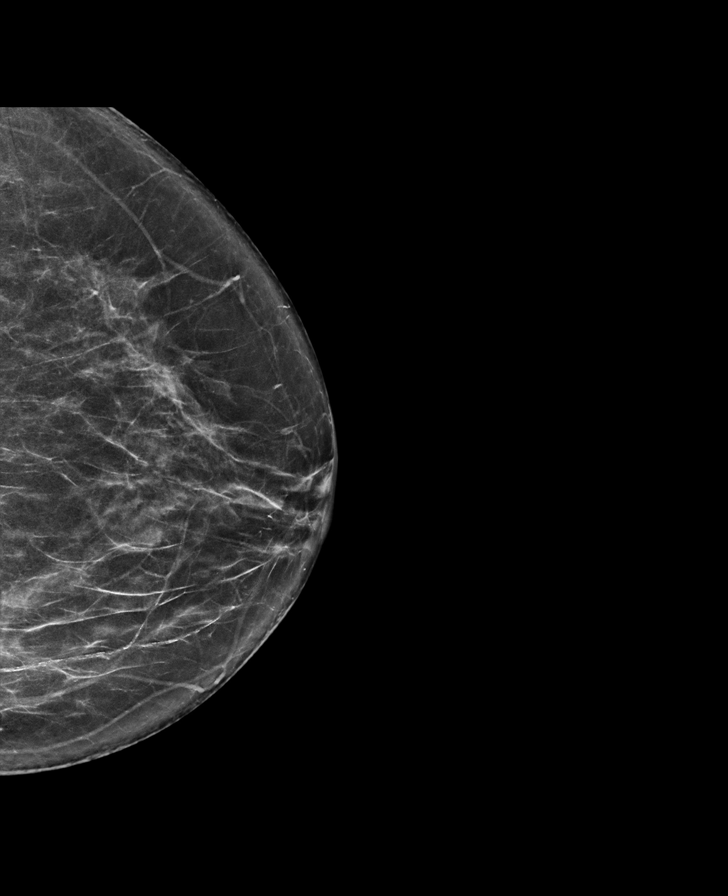

[R CC synth-2D]
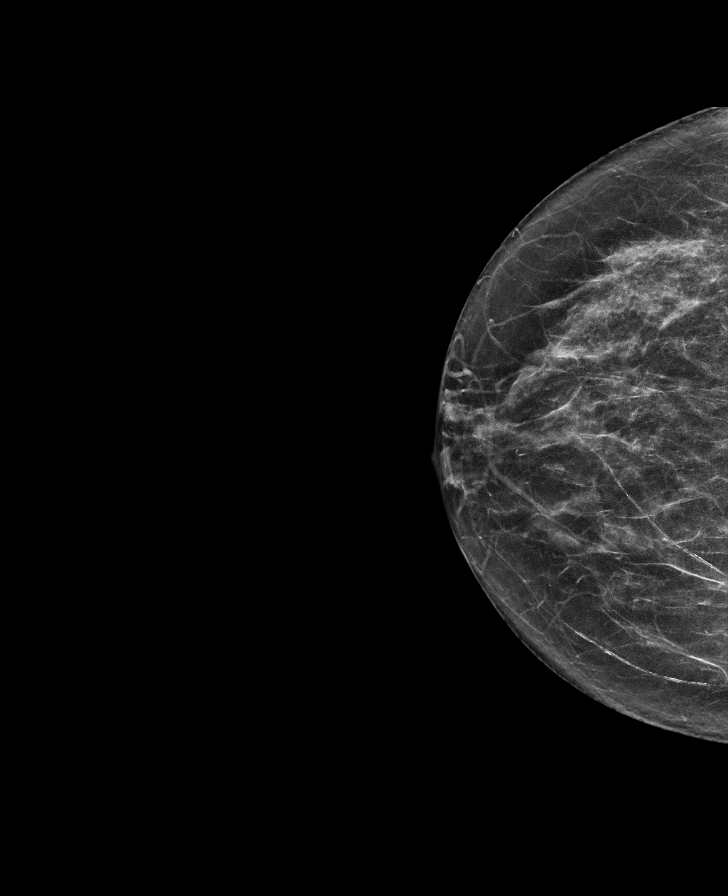

[R MLO synth-2D]
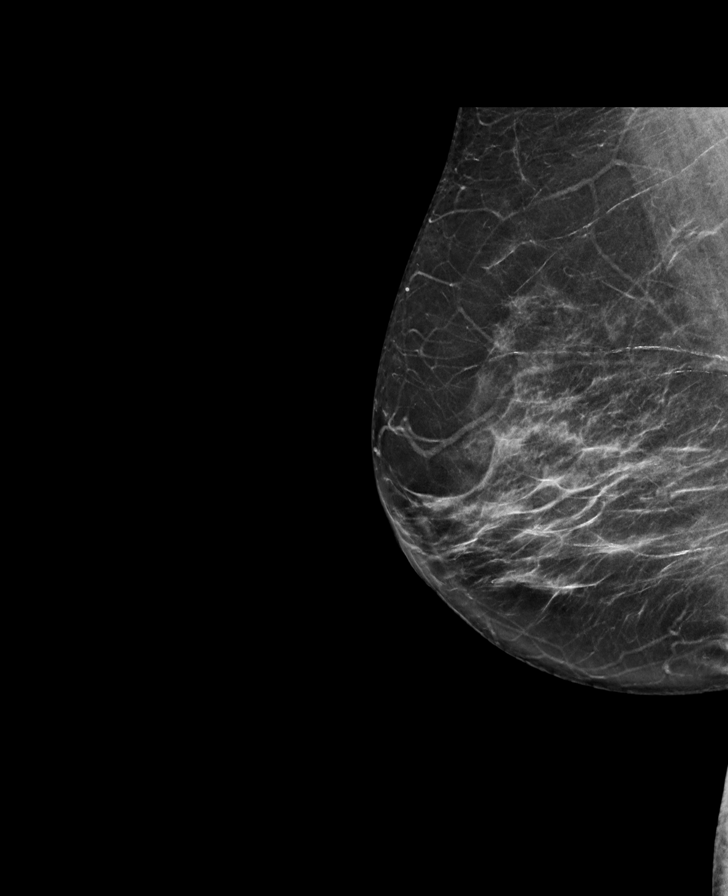

[L MLO tomo · tomo slice 39/77.0]
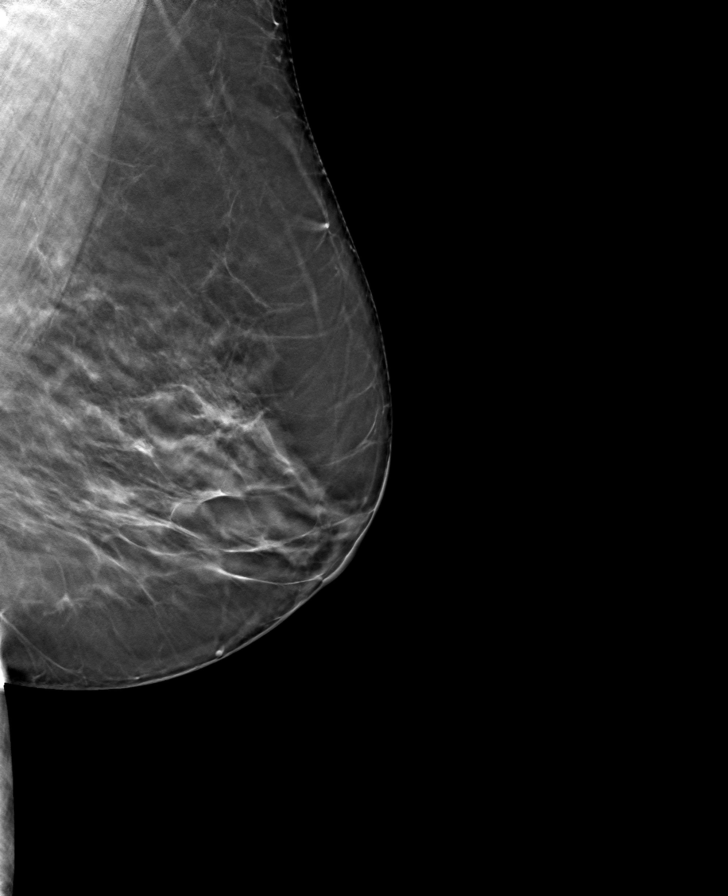

[R MLO tomo · tomo slice 39/76.0]
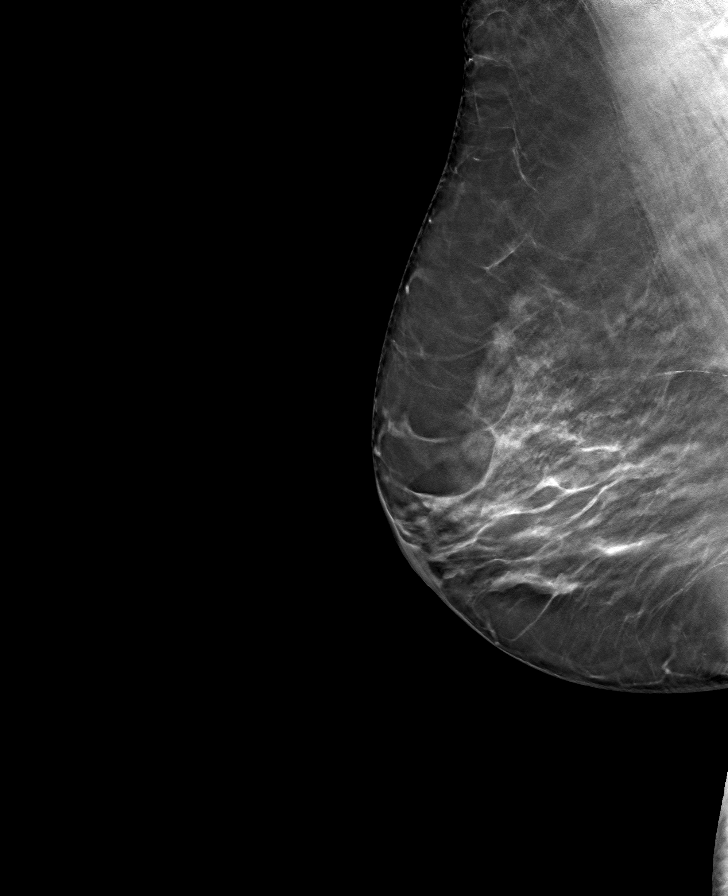

[R CC tomo · tomo slice 33/64.0]
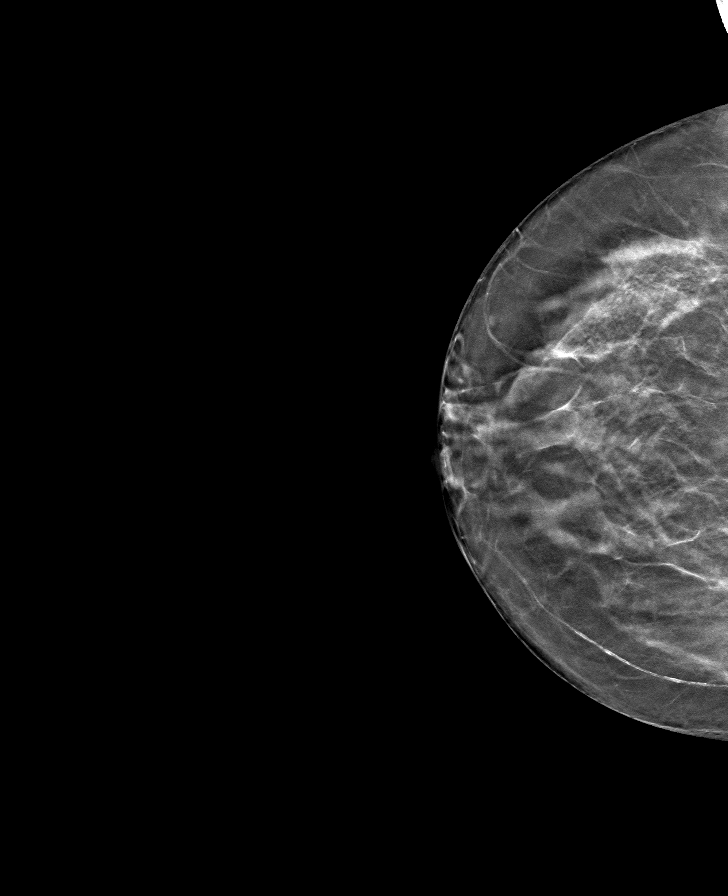

[L CC tomo · tomo slice 37/73.0]
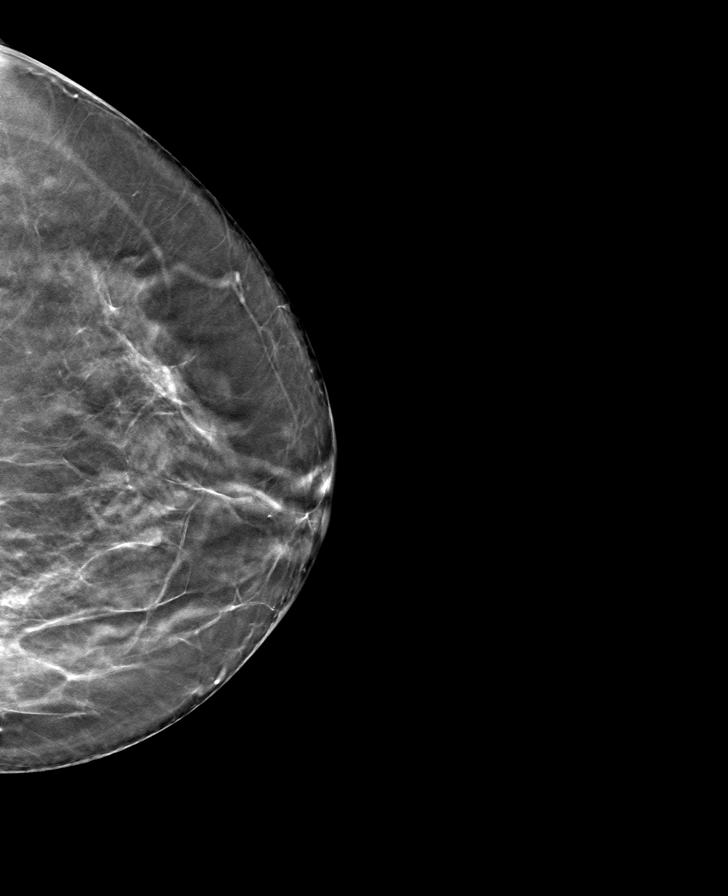

[8 of 24 positions shown; findings below may reference images not displayed]

ACR Breast Density Category b: There are scattered areas of
fibroglandular density.
FINDINGS: There are no findings suspicious for malignancy. Images were
processed with CAD.
IMPRESSION: No mammographic evidence of malignancy. A result letter of this
screening mammogram will be mailed directly to the patient.

RECOMMENDATION:
Screening mammogram in one year. (Code:[id number])

BI-RADS CATEGORY  1: Negative.

## 2019-12-25 ENCOUNTER — Ambulatory Visit: Payer: Medicare Other | Admitting: Cardiovascular Disease

## 2020-02-14 ENCOUNTER — Encounter: Payer: Self-pay | Admitting: Cardiovascular Disease

## 2020-02-14 ENCOUNTER — Ambulatory Visit: Payer: Medicare Other | Admitting: Cardiovascular Disease

## 2020-02-14 ENCOUNTER — Other Ambulatory Visit: Payer: Self-pay

## 2020-02-14 VITALS — BP 115/73 | HR 79 | Temp 96.8°F | Ht 62.0 in | Wt 208.8 lb

## 2020-02-14 DIAGNOSIS — R0600 Dyspnea, unspecified: Secondary | ICD-10-CM

## 2020-02-14 DIAGNOSIS — Z01812 Encounter for preprocedural laboratory examination: Secondary | ICD-10-CM | POA: Diagnosis not present

## 2020-02-14 DIAGNOSIS — R0609 Other forms of dyspnea: Secondary | ICD-10-CM

## 2020-02-14 DIAGNOSIS — R079 Chest pain, unspecified: Secondary | ICD-10-CM

## 2020-02-14 DIAGNOSIS — R0602 Shortness of breath: Secondary | ICD-10-CM | POA: Diagnosis not present

## 2020-02-14 DIAGNOSIS — I700 Atherosclerosis of aorta: Secondary | ICD-10-CM | POA: Insufficient documentation

## 2020-02-14 DIAGNOSIS — G4730 Sleep apnea, unspecified: Secondary | ICD-10-CM | POA: Diagnosis not present

## 2020-02-14 DIAGNOSIS — E7800 Pure hypercholesterolemia, unspecified: Secondary | ICD-10-CM

## 2020-02-14 HISTORY — DX: Pure hypercholesterolemia, unspecified: E78.00

## 2020-02-14 HISTORY — DX: Atherosclerosis of aorta: I70.0

## 2020-02-14 MED ORDER — METOPROLOL TARTRATE 100 MG PO TABS: ORAL_TABLET | ORAL | 0 refills | Status: DC

## 2020-02-14 NOTE — Progress Notes (Signed)
Cardiology Office Note   Date:  02/14/2020   ID:  Kristina Shannon, DOB [birth year], MRN [id number]  PCP:  Henrine Screws, MD  Cardiologist:   Chilton Si, MD   No chief complaint on file.    History of Present Illness: Kristina Shannon is a 69 y.o. female with Rheumatoid arthritis, OSA, SLE, artheroslerosis of the aorta, and osteoporosis, who presents for follow up.  She wa initially seen in 2018 for an evaluation of chest pressure.  Kristina Shannon saw Dr. Henrine Screws on 08/31/16 and reported palpitations and chest pressure.  BMP was unremarkable.  She was referred to cardiology for further evaluation.  She was referred for an exercise Myoview 09/2016 that revealed LVEF 66% and was negative for ischemia.  She had an echo with LVEF 55-50% and grade 1 diastolic dysfunction and was otherwise unremarkable.   She has been feeling well but continues to have chest discomfort that occurs both with exertion.  It is also hard for her to breathe.  She thinks that it may be related to her weight. She tries to walk regularly and does aerobics in her house.  She is also eating a more plant-based diet.    Kristina Shannon has a history of OSA and previously uses a CPAP but stopped over 4 years ago.  She was unable to tolerate the mask.  She reports that she has been stressed since moving to [place].  She also has a harder time walking here because of the hills.    She was last seen in 2018 and reported atypical chest pain or shortness of breath.  Given her prior ischemia evaluation, her symptoms were thought to be attributable to untreated  sleep apnea and she was referred for a sleep study.  She saw Corine Shelter, PA-C on 07/2018.  At that time she complained of calf pain concerning for PAD and continued chest pain shortness of breath.  She had ABIs 10/2018 that were normal.  She also had an echo at that time that revealed LVEF greater than 65% with mild LVH and grade 1 diastolic dysfunction.  She had a CT of the abdomen  and pelvis in 07/2017 that revealed aorto iliac calcifications.  She continues to have a few "twinges" in her chest.  She started back smoking last week and felt a faint discomfort.  It feels like a prick in her chest.  She also reports swelling in her legs.  He is getting injections in her hip and feels like there is no blood running into her legs.  Her legs are always swollen and don't improve with elevation.  She plans to start walking again now that she stopped smoking.  She has to stop after walking up hills and after 3-4 blocks due to shortness of breath.  She has no orthopnea or PND.  She reports orthostasis when she has to have a bowel movement.  She notes that several family members have lymphedema including her mother, daughter, and son.  She also reports difficulty with acid reflux and ulcers.   Past Medical History:  Diagnosis Date  . Atherosclerosis of aorta (HCC) 02/14/2020  . Atypical chest pain 09/08/2016  . Fibromyalgia   . Lupus (HCC)   . Osteoporosis   . Pure hypercholesterolemia 02/14/2020  . RA (rheumatoid arthritis) (HCC)   . Shortness of breath 09/08/2016    Past Surgical History:  Procedure Laterality Date  . ABDOMINAL HYSTERECTOMY    . BILATERAL CARPAL TUNNEL RELEASE    .  EYE SURGERY Bilateral   . TUBAL LIGATION       Current Outpatient Medications  Medication Sig Dispense Refill  . Abatacept (ORENCIA CLICKJECT) 125 MG/ML SOAJ Inject 125 mg into the skin every Friday.     . Abatacept 125 MG/ML SOAJ Inject into the skin.    Marland Kitchen atorvastatin (LIPITOR) 10 MG tablet Take 10 mg by mouth daily.    Vedia Coffer Pepper-Turmeric (TURMERIC CURCUMIN) 09-998 MG CAPS Take 1,500 mg by mouth.    . furosemide (LASIX) 20 MG tablet Take 20 mg by mouth daily.    . hydroxychloroquine (PLAQUENIL) 200 MG tablet Take 400 mg by mouth daily.    Marland Kitchen KLOR-CON M20 20 MEQ tablet Take 20 mEq by mouth daily.    Marland Kitchen leflunomide (ARAVA) 20 MG tablet Take 20 mg by mouth daily.    . meloxicam (MOBIC) 15 MG  tablet Take 1 tablet (15 mg total) by mouth daily. TAKE WITH MEALS 30 tablet 0  . omega-3 fish oil (MAXEPA) 1000 MG CAPS capsule Take 900 mg by mouth daily.    . pantoprazole (PROTONIX) 40 MG tablet Take 40 mg by mouth daily.    Marland Kitchen Specialty Vitamins Products (MAGNESIUM, AMINO ACID CHELATE,) 133 MG tablet Take 400 tablets by mouth 2 (two) times daily.    . metoprolol tartrate (LOPRESSOR) 100 MG tablet TAKE 1 TABLET 2 HOURS PRIOR TO CT 1 tablet 0   No current facility-administered medications for this visit.    Allergies:   Augmentin [amoxicillin-pot clavulanate]    Social History:  The patient  reports that she quit smoking about 4 years ago. She has never used smokeless tobacco.   Family History:  The patient's family history includes Acute lymphoblastic leukemia in her father; Blindness in her mother; Breast cancer (age of onset: 71) in her daughter; COPD in her mother; Congenital heart disease in her mother; Heart attack in her brother and father; Heart disease in her father; Heart failure in her mother; Leukemia in her father; Lung cancer in her brother; Multiple sclerosis in her brother; Stroke in her mother; Thyroid disease in her brother and another family member.    ROS:  Please see the history of present illness.   Otherwise, review of systems are positive for low back pain, skin peeling off.   All other systems are reviewed and negative.    PHYSICAL EXAM: VS:  BP 115/73   Pulse 79   Temp (!) 96.8 F (36 C)   Ht 5\' 2"  (1.575 m)   Wt 208 lb 12.8 oz (94.7 kg)   SpO2 99%   BMI 38.19 kg/m  , BMI Body mass index is 38.19 kg/m. GENERAL:  Well appearing.  No acute distress. HEENT:  Pupils equal round and reactive, fundi not visualized, oral mucosa unremarkable NECK:  No jugular venous distention, waveform within normal limits, carotid upstroke brisk and symmetric, no bruits LUNGS:  Clear to auscultation bilaterally.  No crackles, wheezes or rhonhi HEART:  RRR.  PMI not displaced  or sustained,S1 and S2 within normal limits, no S3, no S4, no clicks, no rubs, no murmurs ABD:  Flat, positive bowel sounds normal in frequency in pitch, no bruits, no rebound, no guarding, no midline pulsatile mass, no hepatomegaly, no splenomegaly EXT:  2 plus pulses throughout, bilateral lymphedema, no cyanosis no clubbing SKIN:  No rashes no nodules NEURO:  Cranial nerves II through XII grossly intact, motor grossly intact throughout PSYCH:  Cognitively intact, oriented to person place and time  EKG:  EKG is not ordered today. The ekg ordered 09/08/16 demonstrates sinus rhythm. Rate 66 bpm. Low voltage limb leads and precordial leads.  Exercise Myoview 09/23/16: The left ventricular ejection fraction is hyperdynamic (>65%).  Nuclear stress EF: 66%.  Blood pressure demonstrated a normal response to exercise.  No T wave inversion was noted during stress.  There was no ST segment deviation noted during stress.  This is a low risk study.   Echo 10/2018:  Impressions:  - Normal LV systolic function; mild diastolic dysfunction; mild MR   and TR. Echo 10/2018: 1. The left ventricle has hyperdynamic systolic function, with an  ejection fraction of >65%. The cavity size was normal. There is mild  asymmetric left ventricular hypertrophy. Left ventricular diastolic  Doppler parameters are consistent with impaired  relaxation.  2. The right ventricle has normal systolic function. The cavity was  normal. There is no increase in right ventricular wall thickness.  3. No evidence of mitral valve stenosis.  4. Aortic valve regurgitation is mild by color flow Doppler. No stenosis  of the aortic valve.  5. The aortic root and ascending aorta are normal in size and structure.  6. The interatrial septum was not assessed.   ABI 10/2018:  Normal   Lexiscan Myoview 10/2018:  The left ventricular ejection fraction is normal (55-65%).  Nuclear stress EF: 63%.  There was no ST segment  deviation noted during stress.  The study is normal.  This is a low risk study.   Normal stress nuclear study with no ischemia or infarction; EF 63 with normal wall motion.   Recent Labs: No results found for requested labs within last 8760 hours.  08/30/16: Sodium 139, potassium 4.1, BUN 15, creatinine 0.78 AST 14, ALT 13  Lipid Panel No results found for: CHOL, TRIG, HDL, CHOLHDL, VLDL, LDLCALC, LDLDIRECT    Wt Readings from Last 3 Encounters:  02/14/20 208 lb 12.8 oz (94.7 kg)  11/16/18 215 lb (97.5 kg)  07/24/18 215 lb (97.5 kg)      ASSESSMENT AND PLAN:  # Atypical chest pain: # Shortness of breath:  Symptoms continue to be atypical.  She does not get much exertion though.  She also has a history of hyperlipidemia and tobacco abuse.  She has had multiple stress tests that have all been normal.  We will get a coronary CT-a to better assess her symptoms and overall risk.  Continue atorvastatin.  The dose will likely need to be increased.  Given her shortness of breath and the lymphedema we will get a BNP to make sure there is no evidence of heart failure.  # Lymphedema:   On exam and appears that she had lymphedema.  She does not appear to be volume overloaded and has no JVD or crackles.  We will work to try and help her get some compression boots.  She can use this in the evening and then wear compression socks throughout the day once the lymphedema is improved.  #Atherosclerosis of the aorta: #Hyperlipidemia: Continue atorvastatin.  LDL goal should be less than 70.  Current medicines are reviewed at length with the patient today.  The patient does not have concerns regarding medicines.  The following changes have been made:  no change  Labs/ tests ordered today include:   Orders Placed This Encounter  Procedures  . CT CORONARY MORPH W/CTA COR W/SCORE W/CA W/CM &/OR WO/CM  . CT CORONARY FRACTIONAL FLOW RESERVE DATA PREP  . CT CORONARY FRACTIONAL FLOW  RESERVE FLUID  ANALYSIS  . Basic metabolic panel  . B Nat Peptide    Disposition:   FU with Chantella Creech C. Duke Salvia, MD, [facility] in 1 year   This note was written with the assistance of speech recognition software.  Please excuse any transcriptional errors.  Signed, Raiyah Speakman C. Duke Salvia, MD, [facility]  02/14/2020 1:25 PM    [facility]

## 2020-02-14 NOTE — Patient Instructions (Addendum)
Medication Instructions:  TAKE METOPROLOL 100 MG 2 HOURS PRIOR TO CT  *If you need a refill on your cardiac medications before your next appointment, please call your pharmacy*  Lab Work: BNP TODAY  BMET 1 WEEK PRIOR TO CT  If you have labs (blood work) drawn today and your tests are completely normal, you will receive your results only by: Marland Kitchen MyChart Message (if you have MyChart) OR . A paper copy in the mail If you have any lab test that is abnormal or we need to change your treatment, we will call you to review the results.   Testing/Procedures: Your physician has requested that you have cardiac CT. Cardiac computed tomography (CT) is a painless test that uses an x-ray machine to take clear, detailed pictures of your heart. For further information please visit [email or web address].tn. Please follow instruction sheet as given. ONCE INSURANCE HAS APPROVED THE OFFICE WILL CALL YOU TO SCHEDULE. IF YOU DO NOT HEAR FROM THE OFFICE IN 2 WEEKS CALL TO FOLLOW UP   Follow-Up: At [facility], you and your health needs are our priority.  As part of our continuing mission to provide you with exceptional heart care, we have created designated Provider Care Teams.  These Care Teams include your primary Cardiologist (physician) and Advanced Practice Providers (APPs -  Physician Assistants and Nurse Practitioners) who all work together to provide you with the care you need, when you need it.  We recommend signing up for the patient portal called "MyChart".  Sign up information is provided on this After Visit Summary.  MyChart is used to connect with patients for Virtual Visits (Telemedicine).  Patients are able to view lab/test results, encounter notes, upcoming appointments, etc.  Non-urgent messages can be sent to your provider as well.   To learn more about what you can do with MyChart, go to [email or web address].    Your next appointment:   12 month(s) You will receive a reminder letter in the mail  two months in advance. If you don't receive a letter, please call our office to schedule the follow-up appointment.  The format for your next appointment:   In Person  Provider:   You may see Skeet Latch, MD or one of the following Advanced Practice Providers on your designated Care Team:    Kerin Ransom, PA-C  [place], [place]  Coletta Memos,   Other Instructions  WILL WORK ON [place]   Your cardiac CT will be scheduled at one of the below locations:   [facility] [address] [place], [place] [address] [phone]  [place] [address] [place], [place] [address] [phone]  If scheduled at [facility], please arrive at the [facility] main entrance of [facility] 30 minutes prior to test start time. Proceed to the [facility] Radiology Department (first floor) to check-in and test prep.  If scheduled at [facility], please arrive 15 mins early for check-in and test prep.  Please follow these instructions carefully (unless otherwise directed):  Hold all erectile dysfunction medications at least 3 days (72 hrs) prior to test.  On the Night Before the Test: . Be sure to Drink plenty of water. . Do not consume any caffeinated/decaffeinated beverages or chocolate 12 hours prior to your test. . Do not take any antihistamines 12 hours prior to your test. . If the patient has contrast allergy: ? Patient will need a  prescription for Prednisone and very clear instructions (as follows): 1. Prednisone 50 mg - take 13 hours prior to test 2. Take another Prednisone 50 mg 7 hours prior to test 3. Take another Prednisone 50 mg 1 hour prior to test 4. Take Benadryl 50 mg 1 hour prior to test . Patient must complete all four doses of above prophylactic medications. . Patient will need a ride after test due to Benadryl.  On the  Day of the Test: . Drink plenty of water. Do not drink any water within one hour of the test. . Do not eat any food 4 hours prior to the test. . You may take your regular medications prior to the test.  . Take metoprolol (Lopressor) two hours prior to test. . HOLD Furosemide/Hydrochlorothiazide morning of the test. . FEMALES- please wear underwire-free bra if available       After the Test: . Drink plenty of water. . After receiving IV contrast, you may experience a mild flushed feeling. This is normal. . On occasion, you may experience a mild rash up to 24 hours after the test. This is not dangerous. If this occurs, you can take Benadryl 25 mg and increase your fluid intake. . If you experience trouble breathing, this can be serious. If it is severe call 911 IMMEDIATELY. If it is mild, please call our office. . If you take any of these medications: Glipizide/Metformin, Avandament, Glucavance, please do not take 48 hours after completing test unless otherwise instructed.   Once we have confirmed authorization from your insurance company, we will call you to set up a date and time for your test. Based on how quickly your insurance processes prior authorizations requests, please allow up to 4 weeks to be contacted for scheduling your Cardiac CT appointment. Be advised that routine Cardiac CT appointments could be scheduled as many as 8 weeks after your provider has ordered it.  For non-scheduling related questions, please contact the cardiac imaging nurse navigator should you have any questions/concerns: Marchia Bond, Cardiac Imaging Nurse Navigator Burley Saver, Interim Cardiac Imaging Nurse [place] [facility] Direct Office Dial: [phone]   For scheduling needs, including cancellations and rescheduling, please call Vivien Rota at [phone], option [phone].    Cardiac CT Angiogram A cardiac CT angiogram is a procedure to look at the heart and the area around the heart.  It may be done to help find the cause of chest pains or other symptoms of heart disease. During this procedure, a substance called contrast dye is injected into the blood vessels in the area to be checked. A large X-ray machine, called a CT scanner, then takes detailed pictures of the heart and the surrounding area. The procedure is also sometimes called a coronary CT angiogram, coronary artery scanning, or CTA. A cardiac CT angiogram allows the health care provider to see how well blood is flowing to and from the heart. The health care provider will be able to see if there are any problems, such as:  Blockage or narrowing of the coronary arteries in the heart.  Fluid around the heart.  Signs of weakness or disease in the muscles, valves, and tissues of the heart. Tell a health care provider about:  Any allergies you have. This is especially important if you have had a previous allergic reaction to contrast dye.  All medicines you are taking, including vitamins, herbs, eye drops, creams, and over-the-counter medicines.  Any blood disorders you have.  Any surgeries you  have had.  Any medical conditions you have.  Whether you are pregnant or may be pregnant.  Any anxiety disorders, chronic pain, or other conditions you have that may increase your stress or prevent you from lying still. What are the risks? Generally, this is a safe procedure. However, problems may occur, including:  Bleeding.  Infection.  Allergic reactions to medicines or dyes.  Damage to other structures or organs.  Kidney damage from the contrast dye that is used.  Increased risk of cancer from radiation exposure. This risk is low. Talk with your health care provider about: ? The risks and benefits of testing. ? How you can receive the lowest dose of radiation. What happens before the procedure?  Wear comfortable clothing and remove any jewelry, glasses, dentures, and hearing aids.  Follow instructions from  your health care provider about eating and drinking. This may include: ? For 12 hours before the procedure -- avoid caffeine. This includes tea, coffee, soda, energy drinks, and diet pills. Drink plenty of water or other fluids that do not have caffeine in them. Being well hydrated can prevent complications. ? For 4-6 hours before the procedure -- stop eating and drinking. The contrast dye can cause nausea, but this is less likely if your stomach is empty.  Ask your health care provider about changing or stopping your regular medicines. This is especially important if you are taking diabetes medicines, blood thinners, or medicines to treat problems with erections (erectile dysfunction). What happens during the procedure?   Hair on your chest may need to be removed so that small sticky patches called electrodes can be placed on your chest. These will transmit information that helps to monitor your heart during the procedure.  An IV will be inserted into one of your veins.  You might be given a medicine to control your heart rate during the procedure. This will help to ensure that good images are obtained.  You will be asked to lie on an exam table. This table will slide in and out of the CT machine during the procedure.  Contrast dye will be injected into the IV. You might feel warm, or you may get a metallic taste in your mouth.  You will be given a medicine called nitroglycerin. This will relax or dilate the arteries in your heart.  The table that you are lying on will move into the CT machine tunnel for the scan.  The person running the machine will give you instructions while the scans are being done. You may be asked to: ? Keep your arms above your head. ? Hold your breath. ? Stay very still, even if the table is moving.  When the scanning is complete, you will be moved out of the machine.  The IV will be removed. The procedure may vary among health care providers and  hospitals. What can I expect after the procedure? After your procedure, it is common to have:  A metallic taste in your mouth from the contrast dye.  A feeling of warmth.  A headache from the nitroglycerin. Follow these instructions at home:  Take over-the-counter and prescription medicines only as told by your health care provider.  If you are told, drink enough fluid to keep your urine pale yellow. This will help to flush the contrast dye out of your body.  Most people can return to their normal activities right after the procedure. Ask your health care provider what activities are safe for you.  It is up  to you to get the results of your procedure. Ask your health care provider, or the department that is doing the procedure, when your results will be ready.  Keep all follow-up visits as told by your health care provider. This is important. Contact a health care provider if:  You have any symptoms of allergy to the contrast dye. These include: ? Shortness of breath. ? Rash or hives. ? A racing heartbeat. Summary  A cardiac CT angiogram is a procedure to look at the heart and the area around the heart. It may be done to help find the cause of chest pains or other symptoms of heart disease.  During this procedure, a large X-ray machine, called a CT scanner, takes detailed pictures of the heart and the surrounding area after a contrast dye has been injected into blood vessels in the area.  Ask your health care provider about changing or stopping your regular medicines before the procedure. This is especially important if you are taking diabetes medicines, blood thinners, or medicines to treat erectile dysfunction.  If you are told, drink enough fluid to keep your urine pale yellow. This will help to flush the contrast dye out of your body. This information is not intended to replace advice given to you by your health care provider. Make sure you discuss any questions you have with  your health care provider. Document Revised: 01/02/2019 Document Reviewed: 01/02/2019 Elsevier Patient Education  [place].

## 2020-02-20 LAB — BASIC METABOLIC PANEL WITH GFR
BUN/Creatinine Ratio: 18 (ref 12–28)
BUN: 17 mg/dL (ref 8–27)
CO2: 25 mmol/L (ref 20–29)
Calcium: 9.1 mg/dL (ref 8.7–10.3)
Chloride: 107 mmol/L — ABNORMAL HIGH (ref 96–106)
Creatinine, Ser: 0.93 mg/dL (ref 0.57–1.00)
GFR calc Af Amer: 72 mL/min/1.73
GFR calc non Af Amer: 62 mL/min/1.73
Glucose: 90 mg/dL (ref 65–99)
Potassium: 4.6 mmol/L (ref 3.5–5.2)
Sodium: 143 mmol/L (ref 134–144)

## 2020-02-20 LAB — BRAIN NATRIURETIC PEPTIDE: BNP: 8.6 pg/mL (ref 0.0–100.0)

## 2020-02-26 ENCOUNTER — Telehealth (HOSPITAL_COMMUNITY): Payer: Self-pay | Admitting: *Deleted

## 2020-02-26 NOTE — Telephone Encounter (Signed)
Reaching out to patient to offer assistance regarding upcoming cardiac imaging study; pt verbalizes understanding of appt date/time, parking situation and where to check in, pre-test NPO status and medications ordered, and verified current allergies; name and call back number provided for further questions should they arise ° °Dola Lunsford Tai RN Navigator Cardiac Imaging °[facility] °[phone] office °[phone] cell ° °

## 2020-02-27 ENCOUNTER — Ambulatory Visit (HOSPITAL_COMMUNITY): Admission: RE | Admit: 2020-02-27 | Payer: Medicare Other | Source: Ambulatory Visit

## 2020-03-06 ENCOUNTER — Telehealth (HOSPITAL_COMMUNITY): Payer: Self-pay | Admitting: *Deleted

## 2020-03-06 NOTE — Telephone Encounter (Signed)

## 2020-03-09 ENCOUNTER — Ambulatory Visit (HOSPITAL_COMMUNITY)
Admission: RE | Admit: 2020-03-09 | Discharge: 2020-03-09 | Disposition: A | Discharge status: Home / Self Care | Payer: Medicare Other | Source: Ambulatory Visit | Attending: Cardiovascular Disease | Admitting: Cardiovascular Disease

## 2020-03-09 ENCOUNTER — Other Ambulatory Visit: Payer: Self-pay

## 2020-03-09 DIAGNOSIS — R0602 Shortness of breath: Secondary | ICD-10-CM | POA: Insufficient documentation

## 2020-03-09 DIAGNOSIS — R079 Chest pain, unspecified: Secondary | ICD-10-CM

## 2020-03-09 DIAGNOSIS — K449 Diaphragmatic hernia without obstruction or gangrene: Secondary | ICD-10-CM | POA: Diagnosis not present

## 2020-03-09 MED ORDER — NITROGLYCERIN 0.4 MG SL SUBL
0.8000 mg | SUBLINGUAL_TABLET | Freq: Once | SUBLINGUAL | Status: AC
  Administered 2020-03-09: 0.8 mg via SUBLINGUAL

## 2020-03-09 MED ORDER — NITROGLYCERIN 0.4 MG SL SUBL
SUBLINGUAL_TABLET | SUBLINGUAL | Status: AC
  Filled 2020-03-09: qty 2

## 2020-03-09 MED ORDER — IOHEXOL 350 MG/ML SOLN
80.0000 mL | Freq: Once | INTRAVENOUS | Status: AC | PRN
  Administered 2020-03-09: 80 mL via INTRAVENOUS

## 2020-03-09 NOTE — Progress Notes (Signed)
CT scan completed. Tolerated well. D/C home ambulatory with husband. Awake and alert. In no distress. 

## 2020-06-02 DIAGNOSIS — R350 Frequency of micturition: Secondary | ICD-10-CM | POA: Diagnosis not present

## 2020-06-04 DIAGNOSIS — M48061 Spinal stenosis, lumbar region without neurogenic claudication: Secondary | ICD-10-CM | POA: Diagnosis not present

## 2020-06-04 DIAGNOSIS — Z79891 Long term (current) use of opiate analgesic: Secondary | ICD-10-CM | POA: Diagnosis not present

## 2020-06-17 DIAGNOSIS — E785 Hyperlipidemia, unspecified: Secondary | ICD-10-CM | POA: Diagnosis not present

## 2020-06-17 DIAGNOSIS — Z79899 Other long term (current) drug therapy: Secondary | ICD-10-CM | POA: Diagnosis not present

## 2020-06-17 DIAGNOSIS — R300 Dysuria: Secondary | ICD-10-CM | POA: Diagnosis not present

## 2020-06-22 DIAGNOSIS — M069 Rheumatoid arthritis, unspecified: Secondary | ICD-10-CM | POA: Diagnosis not present

## 2020-06-22 DIAGNOSIS — G8929 Other chronic pain: Secondary | ICD-10-CM | POA: Diagnosis not present

## 2020-06-22 DIAGNOSIS — E782 Mixed hyperlipidemia: Secondary | ICD-10-CM | POA: Diagnosis not present

## 2020-06-22 DIAGNOSIS — E039 Hypothyroidism, unspecified: Secondary | ICD-10-CM | POA: Diagnosis not present

## 2020-06-22 DIAGNOSIS — K219 Gastro-esophageal reflux disease without esophagitis: Secondary | ICD-10-CM | POA: Diagnosis not present

## 2020-06-22 DIAGNOSIS — M810 Age-related osteoporosis without current pathological fracture: Secondary | ICD-10-CM | POA: Diagnosis not present

## 2020-06-22 DIAGNOSIS — E785 Hyperlipidemia, unspecified: Secondary | ICD-10-CM | POA: Diagnosis not present

## 2020-08-12 DIAGNOSIS — M810 Age-related osteoporosis without current pathological fracture: Secondary | ICD-10-CM | POA: Diagnosis not present

## 2020-08-12 DIAGNOSIS — E782 Mixed hyperlipidemia: Secondary | ICD-10-CM | POA: Diagnosis not present

## 2020-08-12 DIAGNOSIS — K219 Gastro-esophageal reflux disease without esophagitis: Secondary | ICD-10-CM | POA: Diagnosis not present

## 2020-08-12 DIAGNOSIS — E039 Hypothyroidism, unspecified: Secondary | ICD-10-CM | POA: Diagnosis not present

## 2020-08-12 DIAGNOSIS — M069 Rheumatoid arthritis, unspecified: Secondary | ICD-10-CM | POA: Diagnosis not present

## 2020-08-12 DIAGNOSIS — G8929 Other chronic pain: Secondary | ICD-10-CM | POA: Diagnosis not present

## 2020-08-20 DIAGNOSIS — Z79899 Other long term (current) drug therapy: Secondary | ICD-10-CM | POA: Diagnosis not present

## 2020-08-20 DIAGNOSIS — M329 Systemic lupus erythematosus, unspecified: Secondary | ICD-10-CM | POA: Diagnosis not present

## 2020-08-20 DIAGNOSIS — M150 Primary generalized (osteo)arthritis: Secondary | ICD-10-CM | POA: Diagnosis not present

## 2020-08-20 DIAGNOSIS — M797 Fibromyalgia: Secondary | ICD-10-CM | POA: Diagnosis not present

## 2020-08-20 DIAGNOSIS — M0579 Rheumatoid arthritis with rheumatoid factor of multiple sites without organ or systems involvement: Secondary | ICD-10-CM | POA: Diagnosis not present

## 2020-08-20 DIAGNOSIS — N390 Urinary tract infection, site not specified: Secondary | ICD-10-CM | POA: Diagnosis not present

## 2020-09-16 DIAGNOSIS — Z79899 Other long term (current) drug therapy: Secondary | ICD-10-CM | POA: Diagnosis not present

## 2020-09-16 DIAGNOSIS — G894 Chronic pain syndrome: Secondary | ICD-10-CM | POA: Diagnosis not present

## 2020-09-28 DIAGNOSIS — R300 Dysuria: Secondary | ICD-10-CM | POA: Diagnosis not present

## 2020-10-18 DIAGNOSIS — M069 Rheumatoid arthritis, unspecified: Secondary | ICD-10-CM | POA: Diagnosis not present

## 2020-10-18 DIAGNOSIS — K219 Gastro-esophageal reflux disease without esophagitis: Secondary | ICD-10-CM | POA: Diagnosis not present

## 2020-10-18 DIAGNOSIS — E039 Hypothyroidism, unspecified: Secondary | ICD-10-CM | POA: Diagnosis not present

## 2020-10-18 DIAGNOSIS — G8929 Other chronic pain: Secondary | ICD-10-CM | POA: Diagnosis not present

## 2020-10-18 DIAGNOSIS — E782 Mixed hyperlipidemia: Secondary | ICD-10-CM | POA: Diagnosis not present

## 2020-10-18 DIAGNOSIS — M810 Age-related osteoporosis without current pathological fracture: Secondary | ICD-10-CM | POA: Diagnosis not present

## 2020-10-26 DIAGNOSIS — M329 Systemic lupus erythematosus, unspecified: Secondary | ICD-10-CM | POA: Diagnosis not present

## 2020-10-26 DIAGNOSIS — M0579 Rheumatoid arthritis with rheumatoid factor of multiple sites without organ or systems involvement: Secondary | ICD-10-CM | POA: Diagnosis not present

## 2020-10-26 DIAGNOSIS — M797 Fibromyalgia: Secondary | ICD-10-CM | POA: Diagnosis not present

## 2020-10-26 DIAGNOSIS — M150 Primary generalized (osteo)arthritis: Secondary | ICD-10-CM | POA: Diagnosis not present

## 2020-10-26 DIAGNOSIS — Z79899 Other long term (current) drug therapy: Secondary | ICD-10-CM | POA: Diagnosis not present

## 2020-10-26 DIAGNOSIS — N390 Urinary tract infection, site not specified: Secondary | ICD-10-CM | POA: Diagnosis not present

## 2020-11-04 DIAGNOSIS — R300 Dysuria: Secondary | ICD-10-CM | POA: Diagnosis not present

## 2020-11-11 DIAGNOSIS — L03317 Cellulitis of buttock: Secondary | ICD-10-CM | POA: Diagnosis not present

## 2020-11-11 DIAGNOSIS — S30860A Insect bite (nonvenomous) of lower back and pelvis, initial encounter: Secondary | ICD-10-CM | POA: Diagnosis not present

## 2020-11-14 DIAGNOSIS — M48061 Spinal stenosis, lumbar region without neurogenic claudication: Secondary | ICD-10-CM | POA: Diagnosis not present

## 2020-11-14 DIAGNOSIS — G894 Chronic pain syndrome: Secondary | ICD-10-CM | POA: Diagnosis not present

## 2020-11-18 DIAGNOSIS — M329 Systemic lupus erythematosus, unspecified: Secondary | ICD-10-CM | POA: Diagnosis not present

## 2020-11-18 DIAGNOSIS — E039 Hypothyroidism, unspecified: Secondary | ICD-10-CM | POA: Diagnosis not present

## 2020-11-18 DIAGNOSIS — K219 Gastro-esophageal reflux disease without esophagitis: Secondary | ICD-10-CM | POA: Diagnosis not present

## 2020-11-18 DIAGNOSIS — Z79899 Other long term (current) drug therapy: Secondary | ICD-10-CM | POA: Diagnosis not present

## 2020-11-18 DIAGNOSIS — E785 Hyperlipidemia, unspecified: Secondary | ICD-10-CM | POA: Diagnosis not present

## 2020-11-18 DIAGNOSIS — M810 Age-related osteoporosis without current pathological fracture: Secondary | ICD-10-CM | POA: Diagnosis not present

## 2020-11-18 DIAGNOSIS — Z0000 Encounter for general adult medical examination without abnormal findings: Secondary | ICD-10-CM | POA: Diagnosis not present

## 2020-12-14 DIAGNOSIS — R300 Dysuria: Secondary | ICD-10-CM | POA: Diagnosis not present

## 2020-12-15 DIAGNOSIS — G8929 Other chronic pain: Secondary | ICD-10-CM | POA: Diagnosis not present

## 2020-12-15 DIAGNOSIS — M810 Age-related osteoporosis without current pathological fracture: Secondary | ICD-10-CM | POA: Diagnosis not present

## 2020-12-15 DIAGNOSIS — K219 Gastro-esophageal reflux disease without esophagitis: Secondary | ICD-10-CM | POA: Diagnosis not present

## 2020-12-15 DIAGNOSIS — M069 Rheumatoid arthritis, unspecified: Secondary | ICD-10-CM | POA: Diagnosis not present

## 2020-12-15 DIAGNOSIS — E039 Hypothyroidism, unspecified: Secondary | ICD-10-CM | POA: Diagnosis not present

## 2020-12-15 DIAGNOSIS — E782 Mixed hyperlipidemia: Secondary | ICD-10-CM | POA: Diagnosis not present

## 2020-12-15 DIAGNOSIS — E785 Hyperlipidemia, unspecified: Secondary | ICD-10-CM | POA: Diagnosis not present

## 2020-12-23 ENCOUNTER — Other Ambulatory Visit: Payer: Self-pay | Admitting: Physician Assistant

## 2020-12-23 DIAGNOSIS — R1310 Dysphagia, unspecified: Secondary | ICD-10-CM

## 2020-12-23 DIAGNOSIS — B370 Candidal stomatitis: Secondary | ICD-10-CM | POA: Diagnosis not present

## 2020-12-23 DIAGNOSIS — K219 Gastro-esophageal reflux disease without esophagitis: Secondary | ICD-10-CM | POA: Diagnosis not present

## 2020-12-24 DIAGNOSIS — K2289 Other specified disease of esophagus: Secondary | ICD-10-CM | POA: Diagnosis not present

## 2020-12-24 DIAGNOSIS — R1310 Dysphagia, unspecified: Secondary | ICD-10-CM | POA: Diagnosis not present

## 2020-12-29 DIAGNOSIS — K2289 Other specified disease of esophagus: Secondary | ICD-10-CM | POA: Diagnosis not present

## 2021-01-15 DIAGNOSIS — N3020 Other chronic cystitis without hematuria: Secondary | ICD-10-CM | POA: Diagnosis not present

## 2021-01-15 DIAGNOSIS — R3915 Urgency of urination: Secondary | ICD-10-CM | POA: Diagnosis not present

## 2021-01-15 DIAGNOSIS — R350 Frequency of micturition: Secondary | ICD-10-CM | POA: Diagnosis not present

## 2021-01-20 DIAGNOSIS — R0989 Other specified symptoms and signs involving the circulatory and respiratory systems: Secondary | ICD-10-CM | POA: Diagnosis not present

## 2021-01-20 DIAGNOSIS — B370 Candidal stomatitis: Secondary | ICD-10-CM | POA: Diagnosis not present

## 2021-01-20 DIAGNOSIS — K219 Gastro-esophageal reflux disease without esophagitis: Secondary | ICD-10-CM | POA: Diagnosis not present

## 2021-01-29 DIAGNOSIS — M810 Age-related osteoporosis without current pathological fracture: Secondary | ICD-10-CM | POA: Diagnosis not present

## 2021-01-29 DIAGNOSIS — M069 Rheumatoid arthritis, unspecified: Secondary | ICD-10-CM | POA: Diagnosis not present

## 2021-01-29 DIAGNOSIS — E782 Mixed hyperlipidemia: Secondary | ICD-10-CM | POA: Diagnosis not present

## 2021-01-29 DIAGNOSIS — G8929 Other chronic pain: Secondary | ICD-10-CM | POA: Diagnosis not present

## 2021-01-29 DIAGNOSIS — E785 Hyperlipidemia, unspecified: Secondary | ICD-10-CM | POA: Diagnosis not present

## 2021-01-29 DIAGNOSIS — E039 Hypothyroidism, unspecified: Secondary | ICD-10-CM | POA: Diagnosis not present

## 2021-01-29 DIAGNOSIS — G4700 Insomnia, unspecified: Secondary | ICD-10-CM | POA: Diagnosis not present

## 2021-01-29 DIAGNOSIS — K219 Gastro-esophageal reflux disease without esophagitis: Secondary | ICD-10-CM | POA: Diagnosis not present

## 2021-02-02 DIAGNOSIS — M797 Fibromyalgia: Secondary | ICD-10-CM | POA: Diagnosis not present

## 2021-02-02 DIAGNOSIS — Z79899 Other long term (current) drug therapy: Secondary | ICD-10-CM | POA: Diagnosis not present

## 2021-02-02 DIAGNOSIS — M150 Primary generalized (osteo)arthritis: Secondary | ICD-10-CM | POA: Diagnosis not present

## 2021-02-02 DIAGNOSIS — M329 Systemic lupus erythematosus, unspecified: Secondary | ICD-10-CM | POA: Diagnosis not present

## 2021-02-02 DIAGNOSIS — M0579 Rheumatoid arthritis with rheumatoid factor of multiple sites without organ or systems involvement: Secondary | ICD-10-CM | POA: Diagnosis not present

## 2021-02-02 DIAGNOSIS — R5383 Other fatigue: Secondary | ICD-10-CM | POA: Diagnosis not present

## 2021-02-25 ENCOUNTER — Telehealth (HOSPITAL_BASED_OUTPATIENT_CLINIC_OR_DEPARTMENT_OTHER): Payer: Self-pay | Admitting: Cardiovascular Disease

## 2021-02-25 NOTE — Telephone Encounter (Signed)
Pt c/o of Chest Pain: STAT if CP now or developed within 24 hours  1. Are you having CP right now? no  2. Are you experiencing any other symptoms (ex. SOB, nausea, vomiting, sweating)? no  3. How long have you been experiencing CP? For about a week  4. Is your CP continuous or coming and going? Coming and going  5. Have you taken Nitroglycerin? no ?

## 2021-02-25 NOTE — Telephone Encounter (Signed)
LMTCB  Pt had Cardiac CT 02/2020: Chilton Si, MD  03/09/2020  4:39 PM EDT     CT showed only a very small amount of plaque.  This would not cause chest pain or shortness of breath.   Next OV 03/02/21 with Edd Fabian NP

## 2021-03-01 NOTE — Progress Notes (Signed)
Cardiology Office Note:    Date:  03/01/2021   ID:  Kristina Shannon, DOB [birth year], MRN [id number]  PCP:  Henrine Screws, MD   [facility] Providers Cardiologist:  Chilton Si, MD      Referring MD: Henrine Screws, MD   Follow-up for atypical chest pain and shortness of breath.  History of Present Illness:    Kristina Shannon is a 70 y.o. female with a hx of rheumatoid arthritis, obstructive sleep apnea, aortic atherosclerosis, and osteoporosis.  She was seen and evaluated initially in 2018 for chest pressure.  She was sent by her PCP on 08/31/2016 and reported palpitations and chest discomfort/pressure.  Her BMP was unremarkable.  She was referred to cardiology for further evaluation.  She underwent a nuclear stress test 5/18 which showed an EF of 66% and was negative for ischemia.  She had an echocardiogram that showed an LVEF of 55-50%, G1 DD, and was otherwise unremarkable.  She has been feeling well but continued to have episodes of chest discomfort that occurred both with and without exertion.  She reported that it was occasionally hard to breathe.  She felt that her discomfort was related to her weight.  She try to walk regularly and did aerobics in her house.  She had also started to eat more of a plant-based diet.  Her history also includes OSA and she previously used CPAP but stopped several years ago.  She was unable to tolerate the facemask.  She reported that she had been having increased stress about moving to [place].  She also noted a more difficult time walking in the area due to the hills.  She followed up with Corine Shelter, PA-C 3/20.  During that time she reported atypical chest discomfort and shortness of breath.  Her symptoms were felt to be related to untreated sleep apnea and she was referred for sleep study.  She complained of calf pain which was concerning for PAD and continued chest pain with shortness of breath.  Her ABIs 6/20 showed normal findings.   Her echocardiogram at that time showed an LVEF greater than 65%, mild LVH, and G1 DD.  She had a CT of her abdomen pelvis 3/19 which showed aortoiliac calcifications.  She reported twinges in her chest.  She had started back smoking and was reporting thank discomfort.  She describes the sensation as a crick in her chest.  She also reported lower extremity edema.  She was getting injections in her hip and stated it felt like there was no blood running into her legs.  Her lower extremity swelling improved with lower extremity elevation.  She plans to start walking again and was trying to stop smoking.  She was stopping after walking up hills and after 3-4 blocks due to increased work of breathing.  She denied orthopnea and PND.  She reported orthostasis when she was having bowel movements.  She reported several family members with lymphedema including her mother, daughter, and son.  She also discussed difficulty with acid reflux and GI ulcers.    Coronary CTA was ordered 10/21 and showed a coronary calcium score of 7.78 which placed her in the 53rd percentile for age and sex max control.  No evidence of obstructive coronary artery disease was noted.  She presents to the clinic today for follow-up evaluation states she started noticing chest discomfort on 10/30/2020.  She reports the discomfort with vacuuming.  She reports being mainly sedentary.  We reviewed her previous coronary CTA and she  expressed understanding.  She denies changes with her breathing.  She is limited in her mobility due to prior right hip/leg.  She reports that her rheumatologist continues work with her on her progressive nature of arthritis.  Her chest discomfort is reproducible with deep palpation.  I reassured her that her chest discomfort is not related to cardiac issues.  I have recommended that she rest the area and use heat/ice for pain reduction.  She also reports that she has started smoking within the last year.  I will give her the  smoking cessation resources and have him follow-up in 12 months.  Today she denies increased shortness of breath, increased lower extremity edema, increased fatigue, palpitations, melena, hematuria, hemoptysis, diaphoresis, weakness, presyncope, syncope, orthopnea, and PND.   Past Medical History:  Diagnosis Date   Atherosclerosis of aorta (HCC) 02/14/2020   Atypical chest pain 09/08/2016   Fibromyalgia    Lupus (HCC)    Osteoporosis    Pure hypercholesterolemia 02/14/2020   RA (rheumatoid arthritis) (HCC)    Shortness of breath 09/08/2016    Past Surgical History:  Procedure Laterality Date   ABDOMINAL HYSTERECTOMY     BILATERAL CARPAL TUNNEL RELEASE     EYE SURGERY Bilateral    TUBAL LIGATION      Current Medications: No outpatient medications have been marked as taking for the 03/02/21 encounter (Appointment) with Ronney Asters, NP.     Allergies:   Augmentin [amoxicillin-pot clavulanate]   Social History   Socioeconomic History   Marital status: Married    Spouse name: Not on file   Number of children: Not on file   Years of education: Not on file   Highest education level: Not on file  Occupational History   Not on file  Tobacco Use   Smoking status: Former    Types: Cigarettes    Quit date: 04/01/2015    Years since quitting: 5.9   Smokeless tobacco: Never  Substance and Sexual Activity   Alcohol use: Not on file   Drug use: Not on file   Sexual activity: Not on file  Other Topics Concern   Not on file  Social History Narrative   Not on file   Social Determinants of Health   Financial Resource Strain: Not on file  Food Insecurity: Not on file  Transportation Needs: Not on file  Physical Activity: Not on file  Stress: Not on file  Social Connections: Not on file     Family History: The patient's family history includes Acute lymphoblastic leukemia in her father; Blindness in her mother; Breast cancer (age of onset: 42) in her daughter; COPD in her  mother; Congenital heart disease in her mother; Heart attack in her brother and father; Heart disease in her father; Heart failure in her mother; Leukemia in her father; Lung cancer in her brother; Multiple sclerosis in her brother; Stroke in her mother; Thyroid disease in her brother and another family member.  ROS:   Please see the history of present illness.     All other systems reviewed and are negative.   Risk Assessment/Calculations:           Physical Exam:    VS:  There were no vitals taken for this visit.    Wt Readings from Last 3 Encounters:  02/14/20 208 lb 12.8 oz (94.7 kg)  11/16/18 215 lb (97.5 kg)  07/24/18 215 lb (97.5 kg)     GEN:  Well nourished, well developed in no acute  distress HEENT: Normal NECK: No JVD; No carotid bruits LYMPHATICS: No lymphadenopathy CARDIAC: RRR, no murmurs, rubs, gallops, bilateral lower extremity edema RESPIRATORY:  Clear to auscultation without rales, wheezing or rhonchi  ABDOMEN: Soft, non-tender, non-distended MUSCULOSKELETAL:  No edema; No deformity  SKIN: Warm and dry NEUROLOGIC:  Alert and oriented x 3 PSYCHIATRIC:  Normal affect    EKGs/Labs/Other Studies Reviewed:    The following studies were reviewed today: Coronary CTA 03/09/2020  IMPRESSION: 1. Coronary calcium score of 7.78. This was 53rd percentile for age and sex matched control.   2. Normal coronary origin with right dominance.   3. No evidence of obstructive CAD.   Chilton Si, MD     Electronically Signed   By: Chilton Si   On: 03/09/2020 14:12 EKG:  EKG is  ordered today.  The ekg ordered today demonstrates sinus rhythm and occasional premature ventricular complexes prolonged QT 80 bpm  Recent Labs: No results found for requested labs within last 8760 hours.  Recent Lipid Panel No results found for: CHOL, TRIG, HDL, CHOLHDL, VLDL, LDLCALC, LDLDIRECT  ASSESSMENT & PLAN    Atypical chest pain/musculoskeletal pain-reports right  sided chest discomfort since 02/25/2021.  Reproducible with deep palpation coronary CTA 03/09/2020 showed no evidence of obstructive CAD.  Details above. Continue atorvastatin May use heat/ice and rest for the affected area. Heart healthy low-sodium diet-salty 6 given Increase physical activity as tolerated Patient reassured that her discomfort was not related to cardiac issues.  Essential hypertension-BP today 138/80.  Well-controlled at home. Continue metoprolol Heart healthy low-sodium diet-salty 6 given Increase physical activity as tolerated  Shortness of breath-chronic stable.  Continues to note increased work of breathing with increased physical activity.  Denies increased work of breathing with normal activities.  Has stopped smoking. Heart healthy low-sodium diet-salty 6 given Increase physical activity as tolerated Congratulated on smoking cessation  Hyperlipidemia/aortic atherosclerosis-LDL 94 on 11/18/2020 Continue atorvastatin, omega-3 fatty acids Heart healthy low-sodium high-fiber diet.   Increase physical activity as tolerated Follows with PCP  Lymphedema-denies orthopnea and PND.  Reports compliance with lower extremity compression stockings.  Reports compliance with compression boots.  Tobacco abuse-smoking 2 packs/week.  Smoking cessation recommended Smoking cessation information given   Disposition: Follow-up with Dr. Duke Salvia or me in 12 months.       Medication Adjustments/Labs and Tests Ordered: Current medicines are reviewed at length with the patient today.  Concerns regarding medicines are outlined above.  No orders of the defined types were placed in this encounter.  No orders of the defined types were placed in this encounter.   There are no Patient Instructions on file for this visit.   Signed, Ronney Asters, NP  03/01/2021 6:30 AM      Notice: This dictation was prepared with Dragon dictation along with smaller phrase technology. Any  transcriptional errors that result from this process are unintentional and may not be corrected upon review.  I spent 14 minutes examining this patient, reviewing medications, and using patient centered shared decision making involving her cardiac care.  Prior to her visit I spent greater than 20 minutes reviewing her past medical history,  medications, and prior cardiac tests.

## 2021-03-02 ENCOUNTER — Other Ambulatory Visit: Payer: Self-pay

## 2021-03-02 ENCOUNTER — Ambulatory Visit (HOSPITAL_BASED_OUTPATIENT_CLINIC_OR_DEPARTMENT_OTHER): Payer: Medicare Other | Admitting: General Practice

## 2021-03-02 ENCOUNTER — Encounter (HOSPITAL_BASED_OUTPATIENT_CLINIC_OR_DEPARTMENT_OTHER): Payer: Self-pay | Admitting: General Practice

## 2021-03-02 VITALS — BP 138/80 | HR 80 | Ht 62.0 in | Wt 215.0 lb

## 2021-03-02 DIAGNOSIS — Z720 Tobacco use: Secondary | ICD-10-CM

## 2021-03-02 DIAGNOSIS — R079 Chest pain, unspecified: Secondary | ICD-10-CM | POA: Diagnosis not present

## 2021-03-02 DIAGNOSIS — I10 Essential (primary) hypertension: Secondary | ICD-10-CM | POA: Diagnosis not present

## 2021-03-02 DIAGNOSIS — E7800 Pure hypercholesterolemia, unspecified: Secondary | ICD-10-CM

## 2021-03-02 DIAGNOSIS — R0602 Shortness of breath: Secondary | ICD-10-CM | POA: Diagnosis not present

## 2021-03-02 DIAGNOSIS — I700 Atherosclerosis of aorta: Secondary | ICD-10-CM

## 2021-03-02 DIAGNOSIS — I890 Lymphedema, not elsewhere classified: Secondary | ICD-10-CM

## 2021-03-02 NOTE — Patient Instructions (Signed)
Medication Instructions:  Your physician recommends that you continue on your current medications as directed. Please refer to the Current Medication list given to you today.  *If you need a refill on your cardiac medications before your next appointment, please call your pharmacy*  Follow-Up: At [facility], you and your health needs are our priority.  As part of our continuing mission to provide you with exceptional heart care, we have created designated Provider Care Teams.  These Care Teams include your primary Cardiologist (physician) and Advanced Practice Providers (APPs -  Physician Assistants and Nurse Practitioners) who all work together to provide you with the care you need, when you need it.  We recommend signing up for the patient portal called "MyChart".  Sign up information is provided on this After Visit Summary.  MyChart is used to connect with patients for Virtual Visits (Telemedicine).  Patients are able to view lab/test results, encounter notes, upcoming appointments, etc.  Non-urgent messages can be sent to your provider as well.   To learn more about what you can do with MyChart, go to [email or web address].    Your next appointment:   12 month(s)  The format for your next appointment:   In Person  Provider:   Chilton Si, MD or --Edd Fabian, NP --Gillian Shields, NP  Other Instructions Edd Fabian, NP has recommended resting your chest and using heat and ice. He has also recommended increasing your physical activity as tolerated, quitting smoking, and following a low sodium diet. Please review the information below. Thank you!    American Heart Association Recommendations for Physical Activity in Adults  Are you fitting in at least 150 minutes (2.5 hours) of heart-pumping physical activity per week? If not, you're not alone. Only about one in five adults and teens get enough exercise to maintain good health. Being more active can help all people think,  feel and sleep better and perform daily tasks more easily. And if you're sedentary, sitting less is a great place to start.  These recommendations are based on the Physical Activity Guidelines for Americans, 2nd edition, published by the U.S. Department of Health and Health and safety inspector, Office of Disease Prevention and Health Promotion. They recommend how much physical activity we need to be healthy. The guidelines are based on current scientific evidence supporting the connections between physical activity, overall health and well-being, disease prevention and quality of life.  AHA Physical Activity Recommendations for Adults     Get at least 150 minutes per week of moderate-intensity aerobic activity or 75 minutes per week of vigorous aerobic activity, or a combination of both, preferably spread throughout the week.  Add moderate- to high-intensity muscle-strengthening activity (such as resistance or weights) on at least 2 days per week. Spend less time sitting. Even light-intensity activity can offset some of the risks of being sedentary.  Gain even more benefits by being active at least 300 minutes (5 hours) per week. Increase amount and intensity gradually over time.   What is intensity?  Physical activity is anything that moves your body and burns calories. This includes things like walking, climbing stairs and stretching.  Aerobic (or "cardio") activity gets your heart rate up and benefits your heart by improving cardiorespiratory fitness. When done at moderate intensity, your heart will beat faster and you'll breathe harder than normal, but you'll still be able to talk. Think of it as a medium or moderate amount of effort.  Examples of moderate-intensity aerobic activities:  brisk walking (at least 2.5 miles  per hour) water aerobics dancing (ballroom or social) gardening tennis (doubles) biking slower than 10 miles per hour Vigorous intensity activities will push your body a  little further. They will require a higher amount of effort. You'll probably get warm and begin to sweat. You won't be able to talk much without getting out of breath.  Examples of vigorous-intensity aerobic activities:  hiking uphill or with a heavy backpack running swimming laps aerobic dancing heavy yardwork like continuous digging or hoeing tennis (singles) cycling 10 miles per hour or faster jumping rope Knowing your target heart rate can also help you track the intensity of your activities.  For maximum benefits, include both moderate- and vigorous-intensity activity in your routine along with strengthening and stretching exercises.  What if I'm just starting to get active?  Don't worry if you can't reach 150 minutes per week just yet. Everyone has to start somewhere. Even if you've been sedentary for years, today is the day you can begin to make healthy changes in your life. Set a reachable goal for today. You can work up toward the recommended amount by increasing your time as you get stronger. Don't let all-or-nothing thinking keep you from doing what you can every day.  The simplest way to get moving and improve your health is to start walking. It's free, easy and can be done just about anywhere, even in place.  Any amount of movement is better than none. And you can break it up into short bouts of activity throughout the day. Taking a brisk walk for five or ten minutes a few times a day will add up.  If you have a chronic condition or disability, talk with your healthcare provider about what types and amounts of physical activity are right for you before making too many changes. But don't wait! Get started today by simply sitting less and moving more, whatever that looks like for you.  The takeaway:  Move more, with more intensity, and sit less. Science has linked being inactive and sitting too much with higher risk of heart disease, type 2 diabetes, colon and lung cancers, and  early death.  It's clear that being more active benefits everyone and helps [place] live longer, healthier lives.  Here are some of the big wins:  Lower risk of heart disease, stroke, type 2 diabetes, high blood pressure, dementia and Alzheimer's, several types of cancer, and some complications of pregnancy\  Better sleep, including improvements in insomnia and obstructive sleep apnea  Improved cognition, including memory, attention and processing speed  Less weight gain, obesity and related chronic health conditions  Better bone health and balance, with less risk of injury from falls  Fewer symptoms of depression and anxiety  Better quality of life and sense of overall well-being   Managing the Challenge of Quitting Smoking Quitting smoking is a physical and mental challenge. You will face cravings, withdrawal symptoms, and temptation. Before quitting, work with your health care provider to make a plan that can help you manage quitting. Preparation can help you quit and keep you from giving in. How to manage lifestyle changes Managing stress Stress can make you want to smoke, and wanting to smoke may cause stress. It is important to find ways to manage your stress. You might try some of the following: Practice relaxation techniques. Breathe slowly and deeply, in through your nose and out through your mouth. Listen to music. Soak in a bath or take a shower. Imagine a peaceful place or vacation.  Get some support. Talk with family or friends about your stress. Join a support group. Talk with a counselor or therapist. Get some physical activity. Go for a walk, run, or bike ride. Play a favorite sport. Practice yoga.  Medicines Talk with your health care provider about medicines that might help you deal with cravings and make quitting easier for you. Relationships Social situations can be difficult when you are quitting smoking. To manage this, you can: Avoid parties and other  social situations where people might be smoking. Avoid alcohol. Leave right away if you have the urge to smoke. Explain to your family and friends that you are quitting smoking. Ask for support and let them know you might be a bit grumpy. Plan activities where smoking is not an option. General instructions Be aware that many people gain weight after they quit smoking. However, not everyone does. To keep from gaining weight, have a plan in place before you quit and stick to the plan after you quit. Your plan should include: Having healthy snacks. When you have a craving, it may help to: Eat popcorn, carrots, celery, or other cut vegetables. Chew sugar-free gum. Changing how you eat. Eat small portion sizes at meals. Eat 4-6 small meals throughout the day instead of 1-2 large meals a day. Be mindful when you eat. Do not watch television or do other things that might distract you as you eat. Exercising regularly. Make time to exercise each day. If you do not have time for a long workout, do short bouts of exercise for 5-10 minutes several times a day. Do some form of strengthening exercise, such as weight lifting. Do some exercise that gets your heart beating and causes you to breathe deeply, such as walking fast, running, swimming, or biking. This is very important. Drinking plenty of water or other low-calorie or no-calorie drinks. Drink 6-8 glasses of water daily.  How to recognize withdrawal symptoms Your body and mind may experience discomfort as you try to get used to not having nicotine in your system. These effects are called withdrawal symptoms. They may include: Feeling hungrier than normal. Having trouble concentrating. Feeling irritable or restless. Having trouble sleeping. Feeling depressed. Craving a cigarette. To manage withdrawal symptoms: Avoid places, people, and activities that trigger your cravings. Remember why you want to quit. Get plenty of sleep. Avoid coffee and  other caffeinated drinks. These may worsen some of your symptoms. These symptoms may surprise you. But be assured that they are normal to have when quitting smoking. How to manage cravings Come up with a plan for how to deal with your cravings. The plan should include the following: A definition of the specific situation you want to deal with. An alternative action you will take. A clear idea for how this action will help. The name of someone who might help you with this. Cravings usually last for 5-10 minutes. Consider taking the following actions to help you with your plan to deal with cravings: Keep your mouth busy. Chew sugar-free gum. Suck on hard candies or a straw. Brush your teeth. Keep your hands and body busy. Change to a different activity right away. Squeeze or play with a ball. Do an activity or a hobby, such as making bead jewelry, practicing needlepoint, or working with wood. Mix up your normal routine. Take a short exercise break. Go for a quick walk or run up and down stairs. Focus on doing something kind or helpful for someone else. Call a friend or  family member to talk during a craving. Join a support group. Contact a quitline. Where to find support To get help or find a support group: Call the National Cancer Institute's Smoking Quitline: 1-800-QUIT NOW 979-758-1875) Visit the website of the Substance Abuse and Mental Health Services Administration: SkateOasis.com.pt Text QUIT to SmokefreeTXT: 500938 Where to find more information Visit these websites to find more information on quitting smoking: National Cancer Institute: www.smokefree.gov American Lung Association: www.lung.org American Cancer Society: www.cancer.org Centers for Disease Control and Prevention: FootballExhibition.com.br American Heart Association: www.heart.org Contact a health care provider if: You want to change your plan for quitting. The medicines you are taking are not helping. Your eating feels out of  control or you cannot sleep. Get help right away if: You feel depressed or become very anxious. Summary Quitting smoking is a physical and mental challenge. You will face cravings, withdrawal symptoms, and temptation to smoke again. Preparation can help you as you go through these challenges. Try different techniques to manage stress, handle social situations, and prevent weight gain. You can deal with cravings by keeping your mouth busy (such as by chewing gum), keeping your hands and body busy, calling family or friends, or contacting a quitline for people who want to quit smoking. You can deal with withdrawal symptoms by avoiding places where people smoke, getting plenty of rest, and avoiding drinks with caffeine. This information is not intended to replace advice given to you by your health care provider. Make sure you discuss any questions you have with your health care provider. Document Revised: 02/26/2019 Document Reviewed: 02/26/2019 Elsevier Patient Education  2022 [employer].

## 2021-03-04 DIAGNOSIS — H5203 Hypermetropia, bilateral: Secondary | ICD-10-CM | POA: Diagnosis not present

## 2021-03-04 DIAGNOSIS — Z961 Presence of intraocular lens: Secondary | ICD-10-CM | POA: Diagnosis not present

## 2021-03-04 DIAGNOSIS — Z79899 Other long term (current) drug therapy: Secondary | ICD-10-CM | POA: Diagnosis not present

## 2021-03-11 DIAGNOSIS — M0579 Rheumatoid arthritis with rheumatoid factor of multiple sites without organ or systems involvement: Secondary | ICD-10-CM | POA: Diagnosis not present

## 2021-03-29 DIAGNOSIS — Z79899 Other long term (current) drug therapy: Secondary | ICD-10-CM | POA: Diagnosis not present

## 2021-03-29 DIAGNOSIS — M0579 Rheumatoid arthritis with rheumatoid factor of multiple sites without organ or systems involvement: Secondary | ICD-10-CM | POA: Diagnosis not present

## 2021-04-12 DIAGNOSIS — M5416 Radiculopathy, lumbar region: Secondary | ICD-10-CM | POA: Diagnosis not present

## 2021-04-12 DIAGNOSIS — Z5181 Encounter for therapeutic drug level monitoring: Secondary | ICD-10-CM | POA: Diagnosis not present

## 2021-04-12 DIAGNOSIS — M329 Systemic lupus erythematosus, unspecified: Secondary | ICD-10-CM | POA: Diagnosis not present

## 2021-04-12 DIAGNOSIS — E039 Hypothyroidism, unspecified: Secondary | ICD-10-CM | POA: Diagnosis not present

## 2021-04-12 DIAGNOSIS — L309 Dermatitis, unspecified: Secondary | ICD-10-CM | POA: Diagnosis not present

## 2021-04-12 DIAGNOSIS — Z79899 Other long term (current) drug therapy: Secondary | ICD-10-CM | POA: Diagnosis not present

## 2021-04-13 DIAGNOSIS — R350 Frequency of micturition: Secondary | ICD-10-CM | POA: Diagnosis not present

## 2021-04-13 DIAGNOSIS — N3020 Other chronic cystitis without hematuria: Secondary | ICD-10-CM | POA: Diagnosis not present

## 2021-04-26 DIAGNOSIS — M0579 Rheumatoid arthritis with rheumatoid factor of multiple sites without organ or systems involvement: Secondary | ICD-10-CM | POA: Diagnosis not present

## 2021-05-04 DIAGNOSIS — M0579 Rheumatoid arthritis with rheumatoid factor of multiple sites without organ or systems involvement: Secondary | ICD-10-CM | POA: Diagnosis not present

## 2021-05-04 DIAGNOSIS — M797 Fibromyalgia: Secondary | ICD-10-CM | POA: Diagnosis not present

## 2021-05-04 DIAGNOSIS — R21 Rash and other nonspecific skin eruption: Secondary | ICD-10-CM | POA: Diagnosis not present

## 2021-05-04 DIAGNOSIS — M150 Primary generalized (osteo)arthritis: Secondary | ICD-10-CM | POA: Diagnosis not present

## 2021-05-04 DIAGNOSIS — M329 Systemic lupus erythematosus, unspecified: Secondary | ICD-10-CM | POA: Diagnosis not present

## 2021-05-04 DIAGNOSIS — Z79899 Other long term (current) drug therapy: Secondary | ICD-10-CM | POA: Diagnosis not present

## 2021-05-05 DIAGNOSIS — E785 Hyperlipidemia, unspecified: Secondary | ICD-10-CM | POA: Diagnosis not present

## 2021-05-05 DIAGNOSIS — E039 Hypothyroidism, unspecified: Secondary | ICD-10-CM | POA: Diagnosis not present

## 2021-05-05 DIAGNOSIS — R030 Elevated blood-pressure reading, without diagnosis of hypertension: Secondary | ICD-10-CM | POA: Diagnosis not present

## 2021-05-05 DIAGNOSIS — K219 Gastro-esophageal reflux disease without esophagitis: Secondary | ICD-10-CM | POA: Diagnosis not present

## 2021-05-05 DIAGNOSIS — M810 Age-related osteoporosis without current pathological fracture: Secondary | ICD-10-CM | POA: Diagnosis not present

## 2021-05-05 DIAGNOSIS — M069 Rheumatoid arthritis, unspecified: Secondary | ICD-10-CM | POA: Diagnosis not present

## 2021-05-05 DIAGNOSIS — Z720 Tobacco use: Secondary | ICD-10-CM | POA: Diagnosis not present

## 2021-05-05 DIAGNOSIS — Z23 Encounter for immunization: Secondary | ICD-10-CM | POA: Diagnosis not present

## 2021-05-05 DIAGNOSIS — M329 Systemic lupus erythematosus, unspecified: Secondary | ICD-10-CM | POA: Diagnosis not present

## 2021-06-14 DIAGNOSIS — R8279 Other abnormal findings on microbiological examination of urine: Secondary | ICD-10-CM | POA: Diagnosis not present

## 2021-06-14 DIAGNOSIS — R350 Frequency of micturition: Secondary | ICD-10-CM | POA: Diagnosis not present

## 2021-06-14 DIAGNOSIS — R3915 Urgency of urination: Secondary | ICD-10-CM | POA: Diagnosis not present

## 2021-06-14 DIAGNOSIS — N3020 Other chronic cystitis without hematuria: Secondary | ICD-10-CM | POA: Diagnosis not present

## 2021-06-22 DIAGNOSIS — L668 Other cicatricial alopecia: Secondary | ICD-10-CM | POA: Diagnosis not present

## 2021-06-29 DIAGNOSIS — M0579 Rheumatoid arthritis with rheumatoid factor of multiple sites without organ or systems involvement: Secondary | ICD-10-CM | POA: Diagnosis not present

## 2021-06-29 DIAGNOSIS — Z13220 Encounter for screening for lipoid disorders: Secondary | ICD-10-CM | POA: Diagnosis not present

## 2021-06-29 DIAGNOSIS — M150 Primary generalized (osteo)arthritis: Secondary | ICD-10-CM | POA: Diagnosis not present

## 2021-06-29 DIAGNOSIS — Z79899 Other long term (current) drug therapy: Secondary | ICD-10-CM | POA: Diagnosis not present

## 2021-06-29 DIAGNOSIS — M329 Systemic lupus erythematosus, unspecified: Secondary | ICD-10-CM | POA: Diagnosis not present

## 2021-06-29 DIAGNOSIS — M797 Fibromyalgia: Secondary | ICD-10-CM | POA: Diagnosis not present

## 2021-07-19 DIAGNOSIS — G894 Chronic pain syndrome: Secondary | ICD-10-CM | POA: Diagnosis not present

## 2021-07-19 DIAGNOSIS — M5136 Other intervertebral disc degeneration, lumbar region: Secondary | ICD-10-CM | POA: Diagnosis not present

## 2021-08-20 DIAGNOSIS — K056 Periodontal disease, unspecified: Secondary | ICD-10-CM | POA: Diagnosis not present

## 2021-08-20 DIAGNOSIS — R300 Dysuria: Secondary | ICD-10-CM | POA: Diagnosis not present

## 2021-08-20 DIAGNOSIS — M069 Rheumatoid arthritis, unspecified: Secondary | ICD-10-CM | POA: Diagnosis not present

## 2021-09-17 DIAGNOSIS — N6320 Unspecified lump in the left breast, unspecified quadrant: Secondary | ICD-10-CM | POA: Diagnosis not present

## 2021-09-17 DIAGNOSIS — M329 Systemic lupus erythematosus, unspecified: Secondary | ICD-10-CM | POA: Diagnosis not present

## 2021-09-17 DIAGNOSIS — M79604 Pain in right leg: Secondary | ICD-10-CM | POA: Diagnosis not present

## 2021-09-17 DIAGNOSIS — K219 Gastro-esophageal reflux disease without esophagitis: Secondary | ICD-10-CM | POA: Diagnosis not present

## 2021-09-17 DIAGNOSIS — M810 Age-related osteoporosis without current pathological fracture: Secondary | ICD-10-CM | POA: Diagnosis not present

## 2021-09-17 DIAGNOSIS — R300 Dysuria: Secondary | ICD-10-CM | POA: Diagnosis not present

## 2021-09-17 DIAGNOSIS — E785 Hyperlipidemia, unspecified: Secondary | ICD-10-CM | POA: Diagnosis not present

## 2021-09-17 DIAGNOSIS — Z8679 Personal history of other diseases of the circulatory system: Secondary | ICD-10-CM | POA: Diagnosis not present

## 2021-09-17 DIAGNOSIS — M069 Rheumatoid arthritis, unspecified: Secondary | ICD-10-CM | POA: Diagnosis not present

## 2021-09-20 ENCOUNTER — Other Ambulatory Visit: Payer: Self-pay | Admitting: Family Medicine

## 2021-09-20 DIAGNOSIS — M79604 Pain in right leg: Secondary | ICD-10-CM

## 2021-09-20 DIAGNOSIS — M810 Age-related osteoporosis without current pathological fracture: Secondary | ICD-10-CM

## 2021-09-21 ENCOUNTER — Ambulatory Visit
Admission: RE | Admit: 2021-09-21 | Discharge: 2021-09-21 | Disposition: A | Discharge status: Home / Self Care | Payer: Medicare Other | Source: Ambulatory Visit | Attending: Family Medicine | Admitting: Family Medicine

## 2021-09-21 ENCOUNTER — Other Ambulatory Visit: Payer: Self-pay | Admitting: Family Medicine

## 2021-09-21 DIAGNOSIS — M79604 Pain in right leg: Secondary | ICD-10-CM

## 2021-09-21 DIAGNOSIS — Z1231 Encounter for screening mammogram for malignant neoplasm of breast: Secondary | ICD-10-CM

## 2021-09-30 DIAGNOSIS — G8929 Other chronic pain: Secondary | ICD-10-CM | POA: Diagnosis not present

## 2021-09-30 DIAGNOSIS — E782 Mixed hyperlipidemia: Secondary | ICD-10-CM | POA: Diagnosis not present

## 2021-09-30 DIAGNOSIS — E039 Hypothyroidism, unspecified: Secondary | ICD-10-CM | POA: Diagnosis not present

## 2021-09-30 DIAGNOSIS — K219 Gastro-esophageal reflux disease without esophagitis: Secondary | ICD-10-CM | POA: Diagnosis not present

## 2021-09-30 DIAGNOSIS — M810 Age-related osteoporosis without current pathological fracture: Secondary | ICD-10-CM | POA: Diagnosis not present

## 2021-09-30 DIAGNOSIS — M069 Rheumatoid arthritis, unspecified: Secondary | ICD-10-CM | POA: Diagnosis not present

## 2021-10-07 DIAGNOSIS — N3020 Other chronic cystitis without hematuria: Secondary | ICD-10-CM | POA: Diagnosis not present

## 2021-10-07 DIAGNOSIS — R350 Frequency of micturition: Secondary | ICD-10-CM | POA: Diagnosis not present

## 2021-10-07 DIAGNOSIS — R3915 Urgency of urination: Secondary | ICD-10-CM | POA: Diagnosis not present

## 2021-10-11 DIAGNOSIS — M797 Fibromyalgia: Secondary | ICD-10-CM | POA: Diagnosis not present

## 2021-10-11 DIAGNOSIS — M1991 Primary osteoarthritis, unspecified site: Secondary | ICD-10-CM | POA: Diagnosis not present

## 2021-10-11 DIAGNOSIS — M329 Systemic lupus erythematosus, unspecified: Secondary | ICD-10-CM | POA: Diagnosis not present

## 2021-10-11 DIAGNOSIS — Z79899 Other long term (current) drug therapy: Secondary | ICD-10-CM | POA: Diagnosis not present

## 2021-10-11 DIAGNOSIS — M0579 Rheumatoid arthritis with rheumatoid factor of multiple sites without organ or systems involvement: Secondary | ICD-10-CM | POA: Diagnosis not present

## 2021-10-11 DIAGNOSIS — M25551 Pain in right hip: Secondary | ICD-10-CM | POA: Diagnosis not present

## 2021-11-11 DIAGNOSIS — E782 Mixed hyperlipidemia: Secondary | ICD-10-CM | POA: Diagnosis not present

## 2021-11-11 DIAGNOSIS — G8929 Other chronic pain: Secondary | ICD-10-CM | POA: Diagnosis not present

## 2021-11-11 DIAGNOSIS — E039 Hypothyroidism, unspecified: Secondary | ICD-10-CM | POA: Diagnosis not present

## 2021-11-11 DIAGNOSIS — M069 Rheumatoid arthritis, unspecified: Secondary | ICD-10-CM | POA: Diagnosis not present

## 2021-11-11 DIAGNOSIS — M810 Age-related osteoporosis without current pathological fracture: Secondary | ICD-10-CM | POA: Diagnosis not present

## 2021-11-11 DIAGNOSIS — K219 Gastro-esophageal reflux disease without esophagitis: Secondary | ICD-10-CM | POA: Diagnosis not present

## 2021-11-26 DIAGNOSIS — Z79891 Long term (current) use of opiate analgesic: Secondary | ICD-10-CM | POA: Diagnosis not present

## 2021-11-26 DIAGNOSIS — G894 Chronic pain syndrome: Secondary | ICD-10-CM | POA: Diagnosis not present

## 2021-11-26 DIAGNOSIS — M5136 Other intervertebral disc degeneration, lumbar region: Secondary | ICD-10-CM | POA: Diagnosis not present

## 2022-02-04 DIAGNOSIS — M0579 Rheumatoid arthritis with rheumatoid factor of multiple sites without organ or systems involvement: Secondary | ICD-10-CM | POA: Diagnosis not present

## 2022-02-04 DIAGNOSIS — Z79899 Other long term (current) drug therapy: Secondary | ICD-10-CM | POA: Diagnosis not present

## 2022-02-04 DIAGNOSIS — R5383 Other fatigue: Secondary | ICD-10-CM | POA: Diagnosis not present

## 2022-02-04 DIAGNOSIS — M1991 Primary osteoarthritis, unspecified site: Secondary | ICD-10-CM | POA: Diagnosis not present

## 2022-02-04 DIAGNOSIS — M797 Fibromyalgia: Secondary | ICD-10-CM | POA: Diagnosis not present

## 2022-02-04 DIAGNOSIS — M329 Systemic lupus erythematosus, unspecified: Secondary | ICD-10-CM | POA: Diagnosis not present

## 2022-02-04 DIAGNOSIS — R350 Frequency of micturition: Secondary | ICD-10-CM | POA: Diagnosis not present

## 2022-03-11 ENCOUNTER — Ambulatory Visit: Payer: Medicare Other

## 2022-03-11 ENCOUNTER — Ambulatory Visit
Admission: RE | Admit: 2022-03-11 | Discharge: 2022-03-11 | Disposition: A | Discharge status: Home / Self Care | Payer: Medicare Other | Source: Ambulatory Visit | Attending: Family Medicine | Admitting: Family Medicine

## 2022-03-11 DIAGNOSIS — M8589 Other specified disorders of bone density and structure, multiple sites: Secondary | ICD-10-CM | POA: Diagnosis not present

## 2022-03-11 DIAGNOSIS — Z780 Asymptomatic menopausal state: Secondary | ICD-10-CM | POA: Diagnosis not present

## 2022-03-11 DIAGNOSIS — M810 Age-related osteoporosis without current pathological fracture: Secondary | ICD-10-CM | POA: Diagnosis not present

## 2022-03-18 DIAGNOSIS — M810 Age-related osteoporosis without current pathological fracture: Secondary | ICD-10-CM | POA: Diagnosis not present

## 2022-03-18 DIAGNOSIS — M329 Systemic lupus erythematosus, unspecified: Secondary | ICD-10-CM | POA: Diagnosis not present

## 2022-03-18 DIAGNOSIS — M069 Rheumatoid arthritis, unspecified: Secondary | ICD-10-CM | POA: Diagnosis not present

## 2022-03-18 DIAGNOSIS — M797 Fibromyalgia: Secondary | ICD-10-CM | POA: Diagnosis not present

## 2022-03-18 DIAGNOSIS — G8929 Other chronic pain: Secondary | ICD-10-CM | POA: Diagnosis not present

## 2022-04-11 ENCOUNTER — Ambulatory Visit: Payer: Medicare Other

## 2022-05-03 DIAGNOSIS — N3020 Other chronic cystitis without hematuria: Secondary | ICD-10-CM | POA: Diagnosis not present

## 2022-05-03 DIAGNOSIS — N8111 Cystocele, midline: Secondary | ICD-10-CM | POA: Diagnosis not present

## 2022-05-03 DIAGNOSIS — R350 Frequency of micturition: Secondary | ICD-10-CM | POA: Diagnosis not present

## 2022-05-03 DIAGNOSIS — R3915 Urgency of urination: Secondary | ICD-10-CM | POA: Diagnosis not present

## 2022-05-09 DIAGNOSIS — M0579 Rheumatoid arthritis with rheumatoid factor of multiple sites without organ or systems involvement: Secondary | ICD-10-CM | POA: Diagnosis not present

## 2022-05-24 DIAGNOSIS — M48061 Spinal stenosis, lumbar region without neurogenic claudication: Secondary | ICD-10-CM | POA: Diagnosis not present

## 2022-05-24 DIAGNOSIS — G894 Chronic pain syndrome: Secondary | ICD-10-CM | POA: Diagnosis not present

## 2022-05-24 DIAGNOSIS — Z79891 Long term (current) use of opiate analgesic: Secondary | ICD-10-CM | POA: Diagnosis not present

## 2022-05-24 DIAGNOSIS — M5136 Other intervertebral disc degeneration, lumbar region: Secondary | ICD-10-CM | POA: Diagnosis not present

## 2022-05-24 DIAGNOSIS — M5459 Other low back pain: Secondary | ICD-10-CM | POA: Diagnosis not present

## 2022-05-24 DIAGNOSIS — M5416 Radiculopathy, lumbar region: Secondary | ICD-10-CM | POA: Diagnosis not present

## 2022-05-24 DIAGNOSIS — Z79899 Other long term (current) drug therapy: Secondary | ICD-10-CM | POA: Diagnosis not present

## 2022-05-24 DIAGNOSIS — Z5181 Encounter for therapeutic drug level monitoring: Secondary | ICD-10-CM | POA: Diagnosis not present

## 2022-06-30 ENCOUNTER — Ambulatory Visit (HOSPITAL_BASED_OUTPATIENT_CLINIC_OR_DEPARTMENT_OTHER): Payer: Medicare Other | Admitting: Family

## 2022-07-03 NOTE — Progress Notes (Unsigned)
Cardiology Office Note:    Date:  07/04/2022   ID:  Kristina Shannon, DOB [birth year], MRN [id number]  PCP:  Henrine Screws, MD   [facility] Providers Cardiologist:  Chilton Si, MD     Referring MD: Henrine Screws, MD   Chief Complaint  Patient presents with   yearly follow up    Seen for Dr. Duke Salvia    History of Present Illness:    Kristina Shannon is a 72 y.o. female with a hx of rheumatoid arthritis, HLD, OSA, aortic atherosclerosis, lupus, osteoporosis.  Initially presented to our practice in 2018 with reports of palpitations and chest discomfort, she underwent a nuclear stress test in 2018 which showed an EF of 66% and negative for ischemia.  Echo showed EF 50 to 55% grade 1 diastolic dysfunction.  She reported occasional DOE, but she felt it was related to her weight.  Coronary CTA was ordered in 2021 and showed a calcium score of 7.78, placing her in the 53rd percentile for age and sex matched control, no obstructive CAD was noted.   Last evaluated in our office by Edd Fabian, NP on 03/01/2021, at that time she continued to report atypical chest pain/musculoskeletal pain, pain was reproducible with deep palpation was felt to be musculoskeletal in nature.  Blood pressure was marginally controlled 138/80.  She presents today for follow-up visit. She has been doing well from a cardiac perspective, she no longer has the intermittent chest pain. She has some DOE, pedal edema, and weight gain. She has lymphedema and is it hard for her to quantify if she has pedal edema or not. Requiring a cane to walk. She does wear the compression pumps each evening for her lymphedema and it has improved somewhat. She was started on Rinvoq ~ 5 months ago and has had some various concerns since then. She plans to see if she can get her upcoming follow up appointment moved up to this month to discuss them. She denies chest pain, palpitations, pnd, orthopnea, n, v, dizziness, syncope,   weight gain, or early satiety.    Past Medical History:  Diagnosis Date   Atherosclerosis of aorta (HCC) 02/14/2020   Atypical chest pain 09/08/2016   Fibromyalgia    Lupus (HCC)    Osteoporosis    Pure hypercholesterolemia 02/14/2020   RA (rheumatoid arthritis) (HCC)    Shortness of breath 09/08/2016    Past Surgical History:  Procedure Laterality Date   ABDOMINAL HYSTERECTOMY     BILATERAL CARPAL TUNNEL RELEASE     EYE SURGERY Bilateral    TUBAL LIGATION      Current Medications: Current Meds  Medication Sig   atorvastatin (LIPITOR) 10 MG tablet Take 10 mg by mouth daily.   furosemide (LASIX) 20 MG tablet Take 20 mg by mouth daily.   hydroxychloroquine (PLAQUENIL) 200 MG tablet Take 400 mg by mouth daily.   KLOR-CON M20 20 MEQ tablet Take 20 mEq by mouth daily.   oxyCODONE-acetaminophen (PERCOCET/ROXICET) 5-325 MG tablet Take 1 tablet by mouth every 8 (eight) hours as needed for moderate pain.   pantoprazole (PROTONIX) 40 MG tablet Take 40 mg by mouth daily.     Allergies:   Augmentin [amoxicillin-pot clavulanate]   Social History   Socioeconomic History   Marital status: Married    Spouse name: Not on file   Number of children: Not on file   Years of education: Not on file   Highest education level: Not on file  Occupational History  Not on file  Tobacco Use   Smoking status: Former    Types: Cigarettes    Quit date: 04/01/2015    Years since quitting: 7.2   Smokeless tobacco: Never  Substance and Sexual Activity   Alcohol use: Not on file   Drug use: Not on file   Sexual activity: Not on file  Other Topics Concern   Not on file  Social History Narrative   Not on file   Social Determinants of Health   Financial Resource Strain: Not on file  Food Insecurity: Not on file  Transportation Needs: Not on file  Physical Activity: Not on file  Stress: Not on file  Social Connections: Not on file     Family History: The patient's family history includes  Acute lymphoblastic leukemia in her father; Blindness in her mother; Breast cancer (age of onset: 63) in her daughter; COPD in her mother; Congenital heart disease in her mother; Heart attack in her brother and father; Heart disease in her father; Heart failure in her mother; Leukemia in her father; Lung cancer in her brother; Multiple sclerosis in her brother; Stroke in her mother; Thyroid disease in her brother and another family member.  ROS:   Please see the history of present illness.    All other systems reviewed and are negative.  EKGs/Labs/Other Studies Reviewed:    The following studies were reviewed today:  03/09/2020 coronary CTA -  IMPRESSION: 1. Coronary calcium score of 7.78. This was 53rd percentile for age and sex matched control.   2. Normal coronary origin with right dominance.   3. No evidence of obstructive CAD.  11/16/2018 Lexiscan -  The left ventricular ejection fraction is normal (55-65%). Nuclear stress EF: 63%. There was no ST segment deviation noted during stress. The study is normal. This is a low risk study.   Normal stress nuclear study with no ischemia or infarction; EF 63 with normal wall motion   11/01/2018 echo complete -EF greater than 65%, mild asymmetric LVH, consistent with impaired relaxation.  Mild AR.      EKG:  EKG is ordered today.  The ekg ordered today demonstrates NSR, HR 79 bpm.   Recent Labs: No results found for requested labs within last 365 days.  Recent Lipid Panel No results found for: "CHOL", "TRIG", "HDL", "CHOLHDL", "VLDL", "LDLCALC", "LDLDIRECT"   Risk Assessment/Calculations:                 Physical Exam:    VS:  BP 130/84   Pulse 79   Ht 5\' 2"  (1.575 m)   Wt 223 lb (101.2 kg)   BMI 40.79 kg/m     Wt Readings from Last 3 Encounters:  07/04/22 223 lb (101.2 kg)  03/02/21 215 lb (97.5 kg)  02/14/20 208 lb 12.8 oz (94.7 kg)     GEN:  Well nourished, well developed in no acute distress HEENT:  Normal NECK: No JVD; No carotid bruits LYMPHATICS: No lymphadenopathy CARDIAC: RRR, no murmurs, rubs, gallops RESPIRATORY:  Clear to auscultation without rales, wheezing or rhonchi  ABDOMEN: Soft, non-tender, non-distended MUSCULOSKELETAL:  trace edema; No deformity  SKIN: Warm and dry NEUROLOGIC:  Alert and oriented x 3 PSYCHIATRIC:  Normal affect   ASSESSMENT:    1. Pure hypercholesterolemia   2. Atherosclerosis of aorta (HCC)   3. Dyspnea on exertion   4. Lymphedema   5. Pedal edema   6. Rheumatoid arthritis involving multiple sites with positive rheumatoid factor (HCC)   7.  Lupus (HCC)    PLAN:    In order of problems listed above:  1. Aortic atherosclerosis - Stable with no anginal symptoms. No indication for ischemic evaluation. Continue atorvastatin 10 mg daily.  2. HLD - 06/29/21 LDL 89, well managed. Continue atorvastatin 10 mg daily. Will check CMET, FLP 3. DOE - This has been persistent for her, but feels like it may be getting worse. She endorses weight gain, up 8 lbs since last visit, she is not sure if it is d/t caloric intake or addition of Rinvoq. Will check CBC to r/o anemia. Consider etiology; deconditioning, anemia, heart failure. Echo as below.  4. Lymphedema/pedal edema - Uses compression sleeves each night, she has noticed some improvement. Pedal edema L slightly > right. Continue lasix every other day for pedal edema. Advise she can take additional dose if needed for her pedal edema. Will check BNP, repeat echo complete (last completed in 2020).  5. Rheumatoid arthritis/lupus - managed by Dr. Lendon Colonel with Rheumatology. She feels like she feels worse with her pain and possibly has had weight gain since starting Rinvoq. She plans to follow up with her Rheumatologist, hopefully this month.      Disposition - CMET, FLP, CBC, CBP, repeat echo complete. Return in 3 months.        Medication Adjustments/Labs and Tests Ordered: Current medicines are reviewed at length  with the patient today.  Concerns regarding medicines are outlined above.  Orders Placed This Encounter  Procedures   Brain natriuretic peptide   CBC   Lipid panel   Comprehensive metabolic panel   EKG 12-Lead   ECHOCARDIOGRAM COMPLETE   No orders of the defined types were placed in this encounter.   Patient Instructions  Medication Instructions:  Your Physician recommend you continue on your current medication as directed.    *If you need a refill on your cardiac medications before your next appointment, please call your pharmacy*   Lab Work: Please return for Lab work within the next week for Fasting Lipid Panel, CMP, BNP, CBC . You may come to the...   [facility] (3rd floor) [address], [place], [place] [address]  Open: 8am-Noon and 1pm-4:30pm  Please ring the doorbell on the small table when you exit the elevator and the Lab Tech will come get you  [facility] [address], [place], [place] [address] Open: 8am-1pm, then 2pm-4:30pm   [facility]- Please see attached locations sheet stapled to your lab work with address and hours.   If you have labs (blood work) drawn today and your tests are completely normal, you will receive your results only by: MyChart Message (if you have MyChart) OR A paper copy in the mail If you have any lab test that is abnormal or we need to change your treatment, we will call you to review the results.   Testing/Procedures: Your physician has requested that you have an echocardiogram. Echocardiography is a painless test that uses sound waves to create images of your heart. It provides your doctor with information about the size and shape of your heart and how well your heart's chambers and valves are working. This procedure takes approximately one hour. There are no restrictions for this procedure. Please do NOT wear cologne, perfume, aftershave, or lotions (deodorant is  allowed). Please arrive 15 minutes prior to your appointment time.    Follow-Up: At [facility], you and your health needs are our priority.  As  part of our continuing mission to provide you with exceptional heart care, we have created designated Provider Care Teams.  These Care Teams include your primary Cardiologist (physician) and Advanced Practice Providers (APPs -  Physician Assistants and Nurse Practitioners) who all work together to provide you with the care you need, when you need it.  We recommend signing up for the patient portal called "MyChart".  Sign up information is provided on this After Visit Summary.  MyChart is used to connect with patients for Virtual Visits (Telemedicine).  Patients are able to view lab/test results, encounter notes, upcoming appointments, etc.  Non-urgent messages can be sent to your provider as well.   To learn more about what you can do with MyChart, go to [email or web address].    Your next appointment:   3 month(s)  Provider:   Chilton Si, MD or Gillian Shields, NP    Other Instructions Heart Healthy Diet Recommendations: A low-salt diet is recommended. Meats should be grilled, baked, or boiled. Avoid fried foods. Focus on lean protein sources like fish or chicken with vegetables and fruits. The American Heart Association is a Chief Technology Officer!  American Heart Association Diet and Lifeystyle Recommendations   Exercise recommendations: The American Heart Association recommends 150 minutes of moderate intensity exercise weekly. Try 30 minutes of moderate intensity exercise 4-5 times per week. This could include walking, jogging, or swimming.     Signed, Flossie Dibble, NP  07/04/2022 12:17 PM    [facility]

## 2022-07-04 ENCOUNTER — Encounter (HOSPITAL_BASED_OUTPATIENT_CLINIC_OR_DEPARTMENT_OTHER): Payer: Self-pay | Admitting: Cardiology

## 2022-07-04 ENCOUNTER — Ambulatory Visit (HOSPITAL_BASED_OUTPATIENT_CLINIC_OR_DEPARTMENT_OTHER): Payer: Medicare Other | Admitting: Cardiology

## 2022-07-04 VITALS — BP 130/84 | HR 79 | Ht 62.0 in | Wt 223.0 lb

## 2022-07-04 DIAGNOSIS — M0579 Rheumatoid arthritis with rheumatoid factor of multiple sites without organ or systems involvement: Secondary | ICD-10-CM | POA: Diagnosis not present

## 2022-07-04 DIAGNOSIS — R600 Localized edema: Secondary | ICD-10-CM

## 2022-07-04 DIAGNOSIS — R0609 Other forms of dyspnea: Secondary | ICD-10-CM | POA: Diagnosis not present

## 2022-07-04 DIAGNOSIS — E7800 Pure hypercholesterolemia, unspecified: Secondary | ICD-10-CM

## 2022-07-04 DIAGNOSIS — I700 Atherosclerosis of aorta: Secondary | ICD-10-CM | POA: Diagnosis not present

## 2022-07-04 DIAGNOSIS — M329 Systemic lupus erythematosus, unspecified: Secondary | ICD-10-CM | POA: Diagnosis not present

## 2022-07-04 DIAGNOSIS — I890 Lymphedema, not elsewhere classified: Secondary | ICD-10-CM | POA: Diagnosis not present

## 2022-07-04 DIAGNOSIS — IMO0002 Reserved for concepts with insufficient information to code with codable children: Secondary | ICD-10-CM

## 2022-07-04 NOTE — Patient Instructions (Signed)
Medication Instructions:  Your Physician recommend you continue on your current medication as directed.    *If you need a refill on your cardiac medications before your next appointment, please call your pharmacy*   Lab Work: Please return for Lab work within the next week for Fasting Lipid Panel, CMP, BNP, CBC . You may come to the...   [facility] (3rd floor) [address], [place], [place] [address]  Open: 8am-Noon and 1pm-4:30pm  Please ring the doorbell on the small table when you exit the elevator and the Lab Tech will come get you  [place] [facility] [address] [place], [place], [place] [address] Open: 8am-1pm, then 2pm-4:30pm   [place]- Please see attached locations sheet stapled to your lab work with address and hours.   If you have labs (blood work) drawn today and your tests are completely normal, you will receive your results only by: [place] (if you have MyChart) OR A paper copy in the mail If you have any lab test that is abnormal or we need to change your treatment, we will call you to review the results.   Testing/Procedures: Your physician has requested that you have an echocardiogram. Echocardiography is a painless test that uses sound waves to create images of your heart. It provides your doctor with information about the size and shape of your heart and how well your heart's chambers and valves are working. This procedure takes approximately one hour. There are no restrictions for this procedure. Please do NOT wear cologne, perfume, aftershave, or lotions (deodorant is allowed). Please arrive 15 minutes prior to your appointment time.    Follow-Up: At [facility], you and your health needs are our priority.  As part of our continuing mission to provide you with exceptional heart care, we have created designated Provider Care Teams.  These Care Teams include your primary Cardiologist (physician)  and Advanced Practice Providers (APPs -  Physician Assistants and Nurse Practitioners) who all work together to provide you with the care you need, when you need it.  We recommend signing up for the patient portal called "MyChart".  Sign up information is provided on this After Visit Summary.  MyChart is used to connect with patients for Virtual Visits (Telemedicine).  Patients are able to view lab/test results, encounter notes, upcoming appointments, etc.  Non-urgent messages can be sent to your provider as well.   To learn more about what you can do with MyChart, go to [email or web address].    Your next appointment:   3 month(s)  Provider:   Skeet Latch, MD or Laurann Montana, NP    Other Instructions Heart Healthy Diet Recommendations: A low-salt diet is recommended. Meats should be grilled, baked, or boiled. Avoid fried foods. Focus on lean protein sources like fish or chicken with vegetables and fruits. The American Heart Association is a Microbiologist!  American Heart Association Diet and Lifeystyle Recommendations   Exercise recommendations: The American Heart Association recommends 150 minutes of moderate intensity exercise weekly. Try 30 minutes of moderate intensity exercise 4-5 times per week. This could include walking, jogging, or swimming.

## 2022-07-27 ENCOUNTER — Ambulatory Visit (HOSPITAL_COMMUNITY): Payer: Medicare Other | Attending: Internal Medicine

## 2022-07-27 ENCOUNTER — Other Ambulatory Visit: Payer: Self-pay | Admitting: Family Medicine

## 2022-07-27 DIAGNOSIS — E7800 Pure hypercholesterolemia, unspecified: Secondary | ICD-10-CM | POA: Insufficient documentation

## 2022-07-27 DIAGNOSIS — I700 Atherosclerosis of aorta: Secondary | ICD-10-CM | POA: Diagnosis present

## 2022-07-27 DIAGNOSIS — R0609 Other forms of dyspnea: Secondary | ICD-10-CM | POA: Diagnosis present

## 2022-07-27 DIAGNOSIS — Z1231 Encounter for screening mammogram for malignant neoplasm of breast: Secondary | ICD-10-CM

## 2022-07-27 LAB — ECHOCARDIOGRAM COMPLETE
Area-P 1/2: 2.89 cm2
P 1/2 time: 279 ms
S' Lateral: 2.4 cm

## 2022-08-01 DIAGNOSIS — M810 Age-related osteoporosis without current pathological fracture: Secondary | ICD-10-CM | POA: Diagnosis not present

## 2022-08-01 DIAGNOSIS — R350 Frequency of micturition: Secondary | ICD-10-CM | POA: Diagnosis not present

## 2022-08-01 DIAGNOSIS — R609 Edema, unspecified: Secondary | ICD-10-CM | POA: Diagnosis not present

## 2022-08-01 DIAGNOSIS — M329 Systemic lupus erythematosus, unspecified: Secondary | ICD-10-CM | POA: Diagnosis not present

## 2022-08-01 DIAGNOSIS — E782 Mixed hyperlipidemia: Secondary | ICD-10-CM | POA: Diagnosis not present

## 2022-08-01 DIAGNOSIS — E039 Hypothyroidism, unspecified: Secondary | ICD-10-CM | POA: Diagnosis not present

## 2022-08-01 DIAGNOSIS — M069 Rheumatoid arthritis, unspecified: Secondary | ICD-10-CM | POA: Diagnosis not present

## 2022-08-01 DIAGNOSIS — R2689 Other abnormalities of gait and mobility: Secondary | ICD-10-CM | POA: Diagnosis not present

## 2022-08-01 DIAGNOSIS — Z79899 Other long term (current) drug therapy: Secondary | ICD-10-CM | POA: Diagnosis not present

## 2022-08-02 DIAGNOSIS — D649 Anemia, unspecified: Secondary | ICD-10-CM | POA: Diagnosis not present

## 2022-08-08 DIAGNOSIS — M797 Fibromyalgia: Secondary | ICD-10-CM | POA: Diagnosis not present

## 2022-08-08 DIAGNOSIS — M0579 Rheumatoid arthritis with rheumatoid factor of multiple sites without organ or systems involvement: Secondary | ICD-10-CM | POA: Diagnosis not present

## 2022-08-08 DIAGNOSIS — Z79899 Other long term (current) drug therapy: Secondary | ICD-10-CM | POA: Diagnosis not present

## 2022-08-08 DIAGNOSIS — M1991 Primary osteoarthritis, unspecified site: Secondary | ICD-10-CM | POA: Diagnosis not present

## 2022-08-08 DIAGNOSIS — K219 Gastro-esophageal reflux disease without esophagitis: Secondary | ICD-10-CM | POA: Diagnosis not present

## 2022-08-08 DIAGNOSIS — D649 Anemia, unspecified: Secondary | ICD-10-CM | POA: Diagnosis not present

## 2022-08-08 DIAGNOSIS — M329 Systemic lupus erythematosus, unspecified: Secondary | ICD-10-CM | POA: Diagnosis not present

## 2022-09-08 DIAGNOSIS — Z1231 Encounter for screening mammogram for malignant neoplasm of breast: Secondary | ICD-10-CM

## 2022-09-15 DIAGNOSIS — N8111 Cystocele, midline: Secondary | ICD-10-CM | POA: Diagnosis not present

## 2022-09-21 DIAGNOSIS — G894 Chronic pain syndrome: Secondary | ICD-10-CM | POA: Diagnosis not present

## 2022-09-21 DIAGNOSIS — M5136 Other intervertebral disc degeneration, lumbar region: Secondary | ICD-10-CM | POA: Diagnosis not present

## 2022-09-21 DIAGNOSIS — M5459 Other low back pain: Secondary | ICD-10-CM | POA: Diagnosis not present

## 2022-09-21 DIAGNOSIS — Z79891 Long term (current) use of opiate analgesic: Secondary | ICD-10-CM | POA: Diagnosis not present

## 2022-10-04 ENCOUNTER — Other Ambulatory Visit: Payer: Self-pay | Admitting: Internal Medicine

## 2022-10-04 ENCOUNTER — Ambulatory Visit (HOSPITAL_BASED_OUTPATIENT_CLINIC_OR_DEPARTMENT_OTHER): Payer: Medicare Other | Admitting: Family

## 2022-10-04 DIAGNOSIS — K219 Gastro-esophageal reflux disease without esophagitis: Secondary | ICD-10-CM | POA: Diagnosis not present

## 2022-10-04 DIAGNOSIS — K5900 Constipation, unspecified: Secondary | ICD-10-CM | POA: Diagnosis not present

## 2022-10-05 ENCOUNTER — Other Ambulatory Visit: Payer: Self-pay | Admitting: Internal Medicine

## 2022-10-05 DIAGNOSIS — K219 Gastro-esophageal reflux disease without esophagitis: Secondary | ICD-10-CM

## 2022-11-07 DIAGNOSIS — M0579 Rheumatoid arthritis with rheumatoid factor of multiple sites without organ or systems involvement: Secondary | ICD-10-CM | POA: Diagnosis not present

## 2022-11-09 ENCOUNTER — Other Ambulatory Visit (HOSPITAL_COMMUNITY): Payer: Self-pay | Admitting: Internal Medicine

## 2022-11-09 DIAGNOSIS — K219 Gastro-esophageal reflux disease without esophagitis: Secondary | ICD-10-CM

## 2022-11-15 DIAGNOSIS — Z79622 Long term (current) use of janus kinase inhibitor: Secondary | ICD-10-CM | POA: Diagnosis not present

## 2022-11-15 DIAGNOSIS — M069 Rheumatoid arthritis, unspecified: Secondary | ICD-10-CM | POA: Diagnosis not present

## 2022-11-15 DIAGNOSIS — N390 Urinary tract infection, site not specified: Secondary | ICD-10-CM | POA: Diagnosis not present

## 2022-11-15 DIAGNOSIS — M797 Fibromyalgia: Secondary | ICD-10-CM | POA: Diagnosis not present

## 2022-11-15 DIAGNOSIS — M329 Systemic lupus erythematosus, unspecified: Secondary | ICD-10-CM | POA: Diagnosis not present

## 2022-11-15 DIAGNOSIS — D84821 Immunodeficiency due to drugs: Secondary | ICD-10-CM | POA: Diagnosis not present

## 2022-11-16 ENCOUNTER — Encounter (HOSPITAL_COMMUNITY): Payer: Self-pay

## 2022-11-16 ENCOUNTER — Ambulatory Visit (HOSPITAL_COMMUNITY): Payer: Medicare Other

## 2022-12-26 DIAGNOSIS — M5459 Other low back pain: Secondary | ICD-10-CM | POA: Diagnosis not present

## 2022-12-26 DIAGNOSIS — G894 Chronic pain syndrome: Secondary | ICD-10-CM | POA: Diagnosis not present

## 2022-12-26 DIAGNOSIS — M5416 Radiculopathy, lumbar region: Secondary | ICD-10-CM | POA: Diagnosis not present

## 2022-12-26 DIAGNOSIS — Z79891 Long term (current) use of opiate analgesic: Secondary | ICD-10-CM | POA: Diagnosis not present

## 2022-12-26 DIAGNOSIS — M48061 Spinal stenosis, lumbar region without neurogenic claudication: Secondary | ICD-10-CM | POA: Diagnosis not present

## 2022-12-26 DIAGNOSIS — M5136 Other intervertebral disc degeneration, lumbar region: Secondary | ICD-10-CM | POA: Diagnosis not present

## 2023-01-25 DIAGNOSIS — Z79899 Other long term (current) drug therapy: Secondary | ICD-10-CM | POA: Diagnosis not present

## 2023-01-25 DIAGNOSIS — M1991 Primary osteoarthritis, unspecified site: Secondary | ICD-10-CM | POA: Diagnosis not present

## 2023-01-25 DIAGNOSIS — R5383 Other fatigue: Secondary | ICD-10-CM | POA: Diagnosis not present

## 2023-01-25 DIAGNOSIS — M797 Fibromyalgia: Secondary | ICD-10-CM | POA: Diagnosis not present

## 2023-01-25 DIAGNOSIS — M0579 Rheumatoid arthritis with rheumatoid factor of multiple sites without organ or systems involvement: Secondary | ICD-10-CM | POA: Diagnosis not present

## 2023-01-25 DIAGNOSIS — M329 Systemic lupus erythematosus, unspecified: Secondary | ICD-10-CM | POA: Diagnosis not present

## 2023-03-16 DIAGNOSIS — N3020 Other chronic cystitis without hematuria: Secondary | ICD-10-CM | POA: Diagnosis not present

## 2023-04-18 DIAGNOSIS — M069 Rheumatoid arthritis, unspecified: Secondary | ICD-10-CM | POA: Diagnosis not present

## 2023-04-18 DIAGNOSIS — R300 Dysuria: Secondary | ICD-10-CM | POA: Diagnosis not present

## 2023-04-18 DIAGNOSIS — M810 Age-related osteoporosis without current pathological fracture: Secondary | ICD-10-CM | POA: Diagnosis not present

## 2023-04-18 DIAGNOSIS — E785 Hyperlipidemia, unspecified: Secondary | ICD-10-CM | POA: Diagnosis not present

## 2023-04-18 DIAGNOSIS — Z0000 Encounter for general adult medical examination without abnormal findings: Secondary | ICD-10-CM | POA: Diagnosis not present

## 2023-04-18 DIAGNOSIS — Z23 Encounter for immunization: Secondary | ICD-10-CM | POA: Diagnosis not present

## 2023-04-18 DIAGNOSIS — M329 Systemic lupus erythematosus, unspecified: Secondary | ICD-10-CM | POA: Diagnosis not present

## 2023-04-18 DIAGNOSIS — E039 Hypothyroidism, unspecified: Secondary | ICD-10-CM | POA: Diagnosis not present

## 2023-04-18 DIAGNOSIS — K219 Gastro-esophageal reflux disease without esophagitis: Secondary | ICD-10-CM | POA: Diagnosis not present

## 2023-05-08 DIAGNOSIS — Z79891 Long term (current) use of opiate analgesic: Secondary | ICD-10-CM | POA: Diagnosis not present

## 2023-05-08 DIAGNOSIS — M5416 Radiculopathy, lumbar region: Secondary | ICD-10-CM | POA: Diagnosis not present

## 2023-05-31 DIAGNOSIS — N390 Urinary tract infection, site not specified: Secondary | ICD-10-CM | POA: Diagnosis not present

## 2023-05-31 DIAGNOSIS — D649 Anemia, unspecified: Secondary | ICD-10-CM | POA: Diagnosis not present

## 2023-05-31 DIAGNOSIS — R946 Abnormal results of thyroid function studies: Secondary | ICD-10-CM | POA: Diagnosis not present

## 2023-07-25 DIAGNOSIS — M797 Fibromyalgia: Secondary | ICD-10-CM | POA: Diagnosis not present

## 2023-07-25 DIAGNOSIS — M0579 Rheumatoid arthritis with rheumatoid factor of multiple sites without organ or systems involvement: Secondary | ICD-10-CM | POA: Diagnosis not present

## 2023-07-25 DIAGNOSIS — M1991 Primary osteoarthritis, unspecified site: Secondary | ICD-10-CM | POA: Diagnosis not present

## 2023-07-25 DIAGNOSIS — M5459 Other low back pain: Secondary | ICD-10-CM | POA: Diagnosis not present

## 2023-07-25 DIAGNOSIS — Z79899 Other long term (current) drug therapy: Secondary | ICD-10-CM | POA: Diagnosis not present

## 2023-07-25 DIAGNOSIS — M329 Systemic lupus erythematosus, unspecified: Secondary | ICD-10-CM | POA: Diagnosis not present

## 2023-07-25 DIAGNOSIS — R350 Frequency of micturition: Secondary | ICD-10-CM | POA: Diagnosis not present

## 2023-08-07 DIAGNOSIS — R682 Dry mouth, unspecified: Secondary | ICD-10-CM | POA: Diagnosis not present

## 2023-08-07 DIAGNOSIS — K5909 Other constipation: Secondary | ICD-10-CM | POA: Diagnosis not present

## 2023-08-07 DIAGNOSIS — R1013 Epigastric pain: Secondary | ICD-10-CM | POA: Diagnosis not present

## 2023-08-11 DIAGNOSIS — N390 Urinary tract infection, site not specified: Secondary | ICD-10-CM | POA: Diagnosis not present

## 2023-08-11 DIAGNOSIS — G894 Chronic pain syndrome: Secondary | ICD-10-CM | POA: Diagnosis not present

## 2023-09-04 DIAGNOSIS — Z8249 Family history of ischemic heart disease and other diseases of the circulatory system: Secondary | ICD-10-CM | POA: Diagnosis not present

## 2023-09-04 DIAGNOSIS — G894 Chronic pain syndrome: Secondary | ICD-10-CM | POA: Diagnosis not present

## 2023-09-04 DIAGNOSIS — R030 Elevated blood-pressure reading, without diagnosis of hypertension: Secondary | ICD-10-CM | POA: Diagnosis not present

## 2023-09-04 DIAGNOSIS — R350 Frequency of micturition: Secondary | ICD-10-CM | POA: Diagnosis not present

## 2023-09-18 DIAGNOSIS — M5416 Radiculopathy, lumbar region: Secondary | ICD-10-CM | POA: Diagnosis not present

## 2023-09-18 DIAGNOSIS — Z79891 Long term (current) use of opiate analgesic: Secondary | ICD-10-CM | POA: Diagnosis not present

## 2023-09-18 DIAGNOSIS — R600 Localized edema: Secondary | ICD-10-CM | POA: Diagnosis not present

## 2023-09-18 DIAGNOSIS — N390 Urinary tract infection, site not specified: Secondary | ICD-10-CM | POA: Diagnosis not present

## 2023-09-18 DIAGNOSIS — G894 Chronic pain syndrome: Secondary | ICD-10-CM | POA: Diagnosis not present

## 2023-09-18 DIAGNOSIS — I679 Cerebrovascular disease, unspecified: Secondary | ICD-10-CM | POA: Diagnosis not present

## 2023-09-19 ENCOUNTER — Other Ambulatory Visit: Payer: Self-pay | Admitting: Family Medicine

## 2023-09-19 DIAGNOSIS — I679 Cerebrovascular disease, unspecified: Secondary | ICD-10-CM

## 2023-09-26 ENCOUNTER — Other Ambulatory Visit (HOSPITAL_COMMUNITY): Payer: Self-pay

## 2023-09-26 MED ORDER — MORPHINE SULFATE 15 MG PO TABS
15.0000 mg | ORAL_TABLET | Freq: Four times a day (QID) | ORAL | 0 refills | Status: AC
Start: 2023-09-26 — End: ?
  Filled 2023-09-26: qty 20, 5d supply, fill #0

## 2023-10-06 DIAGNOSIS — Z9181 History of falling: Secondary | ICD-10-CM | POA: Diagnosis not present

## 2023-10-06 DIAGNOSIS — M069 Rheumatoid arthritis, unspecified: Secondary | ICD-10-CM | POA: Diagnosis not present

## 2023-10-06 DIAGNOSIS — K219 Gastro-esophageal reflux disease without esophagitis: Secondary | ICD-10-CM | POA: Diagnosis not present

## 2023-10-06 DIAGNOSIS — Z79891 Long term (current) use of opiate analgesic: Secondary | ICD-10-CM | POA: Diagnosis not present

## 2023-10-06 DIAGNOSIS — E039 Hypothyroidism, unspecified: Secondary | ICD-10-CM | POA: Diagnosis not present

## 2023-10-06 DIAGNOSIS — M810 Age-related osteoporosis without current pathological fracture: Secondary | ICD-10-CM | POA: Diagnosis not present

## 2023-10-06 DIAGNOSIS — I890 Lymphedema, not elsewhere classified: Secondary | ICD-10-CM | POA: Diagnosis not present

## 2023-10-06 DIAGNOSIS — Z8673 Personal history of transient ischemic attack (TIA), and cerebral infarction without residual deficits: Secondary | ICD-10-CM | POA: Diagnosis not present

## 2023-10-06 DIAGNOSIS — G4730 Sleep apnea, unspecified: Secondary | ICD-10-CM | POA: Diagnosis not present

## 2023-10-06 DIAGNOSIS — N390 Urinary tract infection, site not specified: Secondary | ICD-10-CM | POA: Diagnosis not present

## 2023-10-06 DIAGNOSIS — M329 Systemic lupus erythematosus, unspecified: Secondary | ICD-10-CM | POA: Diagnosis not present

## 2023-10-06 DIAGNOSIS — Z556 Problems related to health literacy: Secondary | ICD-10-CM | POA: Diagnosis not present

## 2023-10-06 DIAGNOSIS — E782 Mixed hyperlipidemia: Secondary | ICD-10-CM | POA: Diagnosis not present

## 2023-10-06 DIAGNOSIS — F17210 Nicotine dependence, cigarettes, uncomplicated: Secondary | ICD-10-CM | POA: Diagnosis not present

## 2023-10-11 DIAGNOSIS — M810 Age-related osteoporosis without current pathological fracture: Secondary | ICD-10-CM | POA: Diagnosis not present

## 2023-10-11 DIAGNOSIS — M069 Rheumatoid arthritis, unspecified: Secondary | ICD-10-CM | POA: Diagnosis not present

## 2023-10-11 DIAGNOSIS — Z8673 Personal history of transient ischemic attack (TIA), and cerebral infarction without residual deficits: Secondary | ICD-10-CM | POA: Diagnosis not present

## 2023-10-11 DIAGNOSIS — F17210 Nicotine dependence, cigarettes, uncomplicated: Secondary | ICD-10-CM | POA: Diagnosis not present

## 2023-10-11 DIAGNOSIS — Z556 Problems related to health literacy: Secondary | ICD-10-CM | POA: Diagnosis not present

## 2023-10-11 DIAGNOSIS — N390 Urinary tract infection, site not specified: Secondary | ICD-10-CM | POA: Diagnosis not present

## 2023-10-11 DIAGNOSIS — I890 Lymphedema, not elsewhere classified: Secondary | ICD-10-CM | POA: Diagnosis not present

## 2023-10-11 DIAGNOSIS — Z79891 Long term (current) use of opiate analgesic: Secondary | ICD-10-CM | POA: Diagnosis not present

## 2023-10-11 DIAGNOSIS — M329 Systemic lupus erythematosus, unspecified: Secondary | ICD-10-CM | POA: Diagnosis not present

## 2023-10-11 DIAGNOSIS — Z9181 History of falling: Secondary | ICD-10-CM | POA: Diagnosis not present

## 2023-10-11 DIAGNOSIS — E039 Hypothyroidism, unspecified: Secondary | ICD-10-CM | POA: Diagnosis not present

## 2023-10-11 DIAGNOSIS — K219 Gastro-esophageal reflux disease without esophagitis: Secondary | ICD-10-CM | POA: Diagnosis not present

## 2023-10-11 DIAGNOSIS — G4730 Sleep apnea, unspecified: Secondary | ICD-10-CM | POA: Diagnosis not present

## 2023-10-11 DIAGNOSIS — E782 Mixed hyperlipidemia: Secondary | ICD-10-CM | POA: Diagnosis not present

## 2023-10-25 ENCOUNTER — Other Ambulatory Visit

## 2023-11-01 DIAGNOSIS — Z79899 Other long term (current) drug therapy: Secondary | ICD-10-CM | POA: Diagnosis not present

## 2023-11-01 DIAGNOSIS — R350 Frequency of micturition: Secondary | ICD-10-CM | POA: Diagnosis not present

## 2023-11-01 DIAGNOSIS — M0579 Rheumatoid arthritis with rheumatoid factor of multiple sites without organ or systems involvement: Secondary | ICD-10-CM | POA: Diagnosis not present

## 2023-11-01 DIAGNOSIS — M329 Systemic lupus erythematosus, unspecified: Secondary | ICD-10-CM | POA: Diagnosis not present

## 2023-11-01 DIAGNOSIS — M25552 Pain in left hip: Secondary | ICD-10-CM | POA: Diagnosis not present

## 2023-11-01 DIAGNOSIS — R300 Dysuria: Secondary | ICD-10-CM | POA: Diagnosis not present

## 2023-11-01 DIAGNOSIS — I890 Lymphedema, not elsewhere classified: Secondary | ICD-10-CM | POA: Diagnosis not present

## 2023-11-01 DIAGNOSIS — M797 Fibromyalgia: Secondary | ICD-10-CM | POA: Diagnosis not present

## 2023-11-01 DIAGNOSIS — M1991 Primary osteoarthritis, unspecified site: Secondary | ICD-10-CM | POA: Diagnosis not present

## 2023-11-27 ENCOUNTER — Telehealth: Payer: Self-pay | Admitting: Neurology

## 2023-11-27 NOTE — Telephone Encounter (Signed)
 Due to a conflict, pt requested to cx appointment

## 2023-12-07 ENCOUNTER — Ambulatory Visit: Admitting: Neurology

## 2023-12-21 DIAGNOSIS — E782 Mixed hyperlipidemia: Secondary | ICD-10-CM | POA: Diagnosis not present

## 2023-12-21 DIAGNOSIS — M069 Rheumatoid arthritis, unspecified: Secondary | ICD-10-CM | POA: Diagnosis not present

## 2023-12-21 DIAGNOSIS — M810 Age-related osteoporosis without current pathological fracture: Secondary | ICD-10-CM | POA: Diagnosis not present

## 2023-12-21 DIAGNOSIS — E039 Hypothyroidism, unspecified: Secondary | ICD-10-CM | POA: Diagnosis not present

## 2023-12-29 DIAGNOSIS — R300 Dysuria: Secondary | ICD-10-CM | POA: Diagnosis not present

## 2023-12-29 DIAGNOSIS — M329 Systemic lupus erythematosus, unspecified: Secondary | ICD-10-CM | POA: Diagnosis not present

## 2024-01-21 DIAGNOSIS — M810 Age-related osteoporosis without current pathological fracture: Secondary | ICD-10-CM | POA: Diagnosis not present

## 2024-01-21 DIAGNOSIS — M069 Rheumatoid arthritis, unspecified: Secondary | ICD-10-CM | POA: Diagnosis not present

## 2024-01-21 DIAGNOSIS — E039 Hypothyroidism, unspecified: Secondary | ICD-10-CM | POA: Diagnosis not present

## 2024-01-21 DIAGNOSIS — E782 Mixed hyperlipidemia: Secondary | ICD-10-CM | POA: Diagnosis not present

## 2024-01-24 DIAGNOSIS — H524 Presbyopia: Secondary | ICD-10-CM | POA: Diagnosis not present

## 2024-01-24 DIAGNOSIS — H26493 Other secondary cataract, bilateral: Secondary | ICD-10-CM | POA: Diagnosis not present

## 2024-01-24 DIAGNOSIS — Z79899 Other long term (current) drug therapy: Secondary | ICD-10-CM | POA: Diagnosis not present

## 2024-01-24 DIAGNOSIS — Z961 Presence of intraocular lens: Secondary | ICD-10-CM | POA: Diagnosis not present

## 2024-01-31 DIAGNOSIS — M5416 Radiculopathy, lumbar region: Secondary | ICD-10-CM | POA: Diagnosis not present

## 2024-01-31 DIAGNOSIS — Z5181 Encounter for therapeutic drug level monitoring: Secondary | ICD-10-CM | POA: Diagnosis not present

## 2024-02-13 DIAGNOSIS — M5416 Radiculopathy, lumbar region: Secondary | ICD-10-CM | POA: Diagnosis not present

## 2024-02-14 DIAGNOSIS — M79641 Pain in right hand: Secondary | ICD-10-CM | POA: Diagnosis not present

## 2024-02-14 DIAGNOSIS — Z79899 Other long term (current) drug therapy: Secondary | ICD-10-CM | POA: Diagnosis not present

## 2024-02-14 DIAGNOSIS — L989 Disorder of the skin and subcutaneous tissue, unspecified: Secondary | ICD-10-CM | POA: Diagnosis not present

## 2024-02-14 DIAGNOSIS — R296 Repeated falls: Secondary | ICD-10-CM | POA: Diagnosis not present

## 2024-02-14 DIAGNOSIS — I890 Lymphedema, not elsewhere classified: Secondary | ICD-10-CM | POA: Diagnosis not present

## 2024-02-14 DIAGNOSIS — R531 Weakness: Secondary | ICD-10-CM | POA: Diagnosis not present

## 2024-02-14 DIAGNOSIS — M0609 Rheumatoid arthritis without rheumatoid factor, multiple sites: Secondary | ICD-10-CM | POA: Diagnosis not present

## 2024-02-14 DIAGNOSIS — M1990 Unspecified osteoarthritis, unspecified site: Secondary | ICD-10-CM | POA: Diagnosis not present

## 2024-02-14 DIAGNOSIS — M79672 Pain in left foot: Secondary | ICD-10-CM | POA: Diagnosis not present

## 2024-02-14 DIAGNOSIS — M79671 Pain in right foot: Secondary | ICD-10-CM | POA: Diagnosis not present

## 2024-02-14 DIAGNOSIS — M79642 Pain in left hand: Secondary | ICD-10-CM | POA: Diagnosis not present

## 2024-02-14 DIAGNOSIS — M329 Systemic lupus erythematosus, unspecified: Secondary | ICD-10-CM | POA: Diagnosis not present

## 2024-02-20 DIAGNOSIS — M069 Rheumatoid arthritis, unspecified: Secondary | ICD-10-CM | POA: Diagnosis not present

## 2024-02-20 DIAGNOSIS — M810 Age-related osteoporosis without current pathological fracture: Secondary | ICD-10-CM | POA: Diagnosis not present

## 2024-02-20 DIAGNOSIS — E039 Hypothyroidism, unspecified: Secondary | ICD-10-CM | POA: Diagnosis not present

## 2024-02-20 DIAGNOSIS — E782 Mixed hyperlipidemia: Secondary | ICD-10-CM | POA: Diagnosis not present

## 2024-02-21 DIAGNOSIS — L280 Lichen simplex chronicus: Secondary | ICD-10-CM | POA: Diagnosis not present

## 2024-02-21 DIAGNOSIS — L309 Dermatitis, unspecified: Secondary | ICD-10-CM | POA: Diagnosis not present

## 2024-03-05 DIAGNOSIS — M5416 Radiculopathy, lumbar region: Secondary | ICD-10-CM | POA: Diagnosis not present

## 2024-04-12 ENCOUNTER — Other Ambulatory Visit: Payer: Self-pay | Admitting: Adult Health Nurse Practitioner

## 2024-04-12 DIAGNOSIS — Z1231 Encounter for screening mammogram for malignant neoplasm of breast: Secondary | ICD-10-CM

## 2024-06-26 ENCOUNTER — Other Ambulatory Visit (HOSPITAL_BASED_OUTPATIENT_CLINIC_OR_DEPARTMENT_OTHER): Payer: Self-pay | Admitting: Adult Health Nurse Practitioner

## 2024-06-26 DIAGNOSIS — Z1231 Encounter for screening mammogram for malignant neoplasm of breast: Secondary | ICD-10-CM
